# Patient Record
Sex: Male | Born: 1990 | Race: White | Hispanic: No | Marital: Married | Smoking: Former smoker
Health system: Southern US, Community
[De-identification: ages and names within clinical notes are randomized; demographics above are authoritative.]

## PROBLEM LIST (undated history)

## (undated) DIAGNOSIS — N200 Calculus of kidney: Secondary | ICD-10-CM

## (undated) DIAGNOSIS — J45909 Unspecified asthma, uncomplicated: Secondary | ICD-10-CM

## (undated) DIAGNOSIS — N289 Disorder of kidney and ureter, unspecified: Secondary | ICD-10-CM

## (undated) DIAGNOSIS — R41840 Attention and concentration deficit: Secondary | ICD-10-CM

## (undated) HISTORY — DX: Attention and concentration deficit: R41.840

## (undated) HISTORY — DX: Unspecified asthma, uncomplicated: J45.909

---
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
## 2013-01-31 LAB — LIPID PANEL
Cholesterol: 170 mg/dL (ref 0–200)
HDL: 48 mg/dL (ref 35–70)
LDL CALC: 92 mg/dL
Triglycerides: 152 mg/dL (ref 40–160)

## 2014-03-06 ENCOUNTER — Encounter (HOSPITAL_COMMUNITY): Payer: Self-pay | Admitting: Emergency Medicine

## 2014-03-06 ENCOUNTER — Emergency Department (HOSPITAL_COMMUNITY): Payer: BC Managed Care – PPO

## 2014-03-06 ENCOUNTER — Emergency Department (INDEPENDENT_AMBULATORY_CARE_PROVIDER_SITE_OTHER)
Admission: EM | Admit: 2014-03-06 | Discharge: 2014-03-06 | Disposition: A | Discharge status: Nursing Home (Includes ICF) | Payer: BC Managed Care – PPO | Source: Home / Self Care | Attending: Emergency Medicine | Admitting: Emergency Medicine

## 2014-03-06 ENCOUNTER — Emergency Department (HOSPITAL_COMMUNITY)
Admission: EM | Admit: 2014-03-06 | Discharge: 2014-03-07 | Disposition: A | Discharge status: Home / Self Care | Payer: BC Managed Care – PPO | Attending: Emergency Medicine | Admitting: Emergency Medicine

## 2014-03-06 DIAGNOSIS — R109 Unspecified abdominal pain: Secondary | ICD-10-CM

## 2014-03-06 DIAGNOSIS — K5792 Diverticulitis of intestine, part unspecified, without perforation or abscess without bleeding: Secondary | ICD-10-CM

## 2014-03-06 DIAGNOSIS — K5732 Diverticulitis of large intestine without perforation or abscess without bleeding: Secondary | ICD-10-CM

## 2014-03-06 DIAGNOSIS — N200 Calculus of kidney: Secondary | ICD-10-CM | POA: Insufficient documentation

## 2014-03-06 DIAGNOSIS — Z87891 Personal history of nicotine dependence: Secondary | ICD-10-CM | POA: Insufficient documentation

## 2014-03-06 LAB — COMPREHENSIVE METABOLIC PANEL
ALK PHOS: 64 U/L (ref 39–117)
ALT: 19 U/L (ref 0–53)
AST: 21 U/L (ref 0–37)
Albumin: 4.7 g/dL (ref 3.5–5.2)
BILIRUBIN TOTAL: 1.1 mg/dL (ref 0.3–1.2)
BUN: 9 mg/dL (ref 6–23)
CHLORIDE: 99 meq/L (ref 96–112)
CO2: 23 meq/L (ref 19–32)
Calcium: 10.1 mg/dL (ref 8.4–10.5)
Creatinine, Ser: 0.91 mg/dL (ref 0.50–1.35)
Glucose, Bld: 113 mg/dL — ABNORMAL HIGH (ref 70–99)
POTASSIUM: 4.1 meq/L (ref 3.7–5.3)
SODIUM: 139 meq/L (ref 137–147)
Total Protein: 8.4 g/dL — ABNORMAL HIGH (ref 6.0–8.3)

## 2014-03-06 LAB — URINALYSIS, ROUTINE W REFLEX MICROSCOPIC
Bilirubin Urine: NEGATIVE
Glucose, UA: NEGATIVE mg/dL
Ketones, ur: 15 mg/dL — AB
Leukocytes, UA: NEGATIVE
NITRITE: NEGATIVE
PROTEIN: 30 mg/dL — AB
Specific Gravity, Urine: 1.023 (ref 1.005–1.030)
Urobilinogen, UA: 1 mg/dL (ref 0.0–1.0)
pH: 6 (ref 5.0–8.0)

## 2014-03-06 LAB — CBC WITH DIFFERENTIAL/PLATELET
BASOS PCT: 0 % (ref 0–1)
Basophils Absolute: 0 10*3/uL (ref 0.0–0.1)
EOS ABS: 0 10*3/uL (ref 0.0–0.7)
Eosinophils Relative: 0 % (ref 0–5)
HCT: 48.3 % (ref 39.0–52.0)
Hemoglobin: 17.6 g/dL — ABNORMAL HIGH (ref 13.0–17.0)
Lymphocytes Relative: 14 % (ref 12–46)
Lymphs Abs: 2.1 10*3/uL (ref 0.7–4.0)
MCH: 32.1 pg (ref 26.0–34.0)
MCHC: 36.4 g/dL — AB (ref 30.0–36.0)
MCV: 88 fL (ref 78.0–100.0)
MONOS PCT: 3 % (ref 3–12)
Monocytes Absolute: 0.4 10*3/uL (ref 0.1–1.0)
NEUTROS ABS: 12.6 10*3/uL — AB (ref 1.7–7.7)
NEUTROS PCT: 83 % — AB (ref 43–77)
PLATELETS: 233 10*3/uL (ref 150–400)
RBC: 5.49 MIL/uL (ref 4.22–5.81)
RDW: 12.1 % (ref 11.5–15.5)
WBC: 15.2 10*3/uL — ABNORMAL HIGH (ref 4.0–10.5)

## 2014-03-06 LAB — URINE MICROSCOPIC-ADD ON

## 2014-03-06 MED ORDER — HYDROMORPHONE HCL PF 1 MG/ML IJ SOLN
INTRAMUSCULAR | Status: AC
  Filled 2014-03-06: qty 2

## 2014-03-06 MED ORDER — ONDANSETRON HCL 4 MG/2ML IJ SOLN
INTRAMUSCULAR | Status: AC
Start: 2014-03-06 — End: 2014-03-06
  Filled 2014-03-06: qty 2

## 2014-03-06 MED ORDER — SODIUM CHLORIDE 0.9 % IV BOLUS (SEPSIS)
500.0000 mL | Freq: Once | INTRAVENOUS | Status: AC
  Administered 2014-03-07: 500 mL via INTRAVENOUS

## 2014-03-06 MED ORDER — IOHEXOL 300 MG/ML  SOLN
20.0000 mL | INTRAMUSCULAR | Status: AC
  Administered 2014-03-06: 25 mL via ORAL

## 2014-03-06 MED ORDER — FENTANYL CITRATE 0.05 MG/ML IJ SOLN
50.0000 ug | Freq: Once | INTRAMUSCULAR | Status: AC
  Administered 2014-03-07: 50 ug via INTRAVENOUS
  Filled 2014-03-06: qty 2

## 2014-03-06 MED ORDER — HYDROMORPHONE HCL PF 1 MG/ML IJ SOLN
2.0000 mg | Freq: Once | INTRAMUSCULAR | Status: AC
  Administered 2014-03-06: 2 mg via INTRAMUSCULAR

## 2014-03-06 MED ORDER — ONDANSETRON HCL 4 MG/2ML IJ SOLN
4.0000 mg | Freq: Once | INTRAMUSCULAR | Status: AC
  Administered 2014-03-06: 4 mg via INTRAMUSCULAR

## 2014-03-06 NOTE — Discharge Instructions (Signed)
We have determined that your problem requires further evaluation in the emergency department.  We will take care of your transport there.  Once at the emergency department, you will be evaluated by a provider and they will order whatever treatment or tests they deem necessary.  We cannot guarantee that they will do any specific test or do any specific treatment.  ° ° ° °Diverticulitis °A diverticulum is a small pouch or sac on the colon. Diverticulosis is the presence of these diverticula on the colon. Diverticulitis is the irritation (inflammation) or infection of diverticula. °CAUSES  °The colon and its diverticula contain bacteria. If food particles block the tiny opening to a diverticulum, the bacteria inside can grow and cause an increase in pressure. This leads to infection and inflammation and is called diverticulitis. °SYMPTOMS  °· Abdominal pain and tenderness. Usually, the pain is located on the left side of your abdomen. However, it could be located elsewhere. °· Fever. °· Bloating. °· Feeling sick to your stomach (nausea). °· Throwing up (vomiting). °· Abnormal stools. °DIAGNOSIS  °Your caregiver will take a history and perform a physical exam. Since many things can cause abdominal pain, other tests may be necessary. Tests may include: °· Blood tests. °· Urine tests. °· X-ray of the abdomen. °· CT scan of the abdomen. °Sometimes, surgery is needed to determine if diverticulitis or other conditions are causing your symptoms. °TREATMENT  °Most of the time, you can be treated without surgery. Treatment includes: °· Resting the bowels by only having liquids for a few days. As you improve, you will need to eat a low-fiber diet. °· Intravenous (IV) fluids if you are losing body fluids (dehydrated). °· Antibiotic medicines that treat infections may be given. °· Pain and nausea medicine, if needed. °· Surgery if the inflamed diverticulum has burst. °HOME CARE INSTRUCTIONS  °· Try a clear liquid diet (broth, tea,  or water for as long as directed by your caregiver). You may then gradually begin a low-fiber diet as tolerated.  °A low-fiber diet is a diet with less than 10 grams of fiber. Choose the foods below to reduce fiber in the diet: °· White breads, cereals, rice, and pasta. °· Cooked fruits and vegetables or soft fresh fruits and vegetables without the skin. °· Ground or well-cooked tender beef, ham, veal, lamb, pork, or poultry. °· Eggs and seafood. °· After your diverticulitis symptoms have improved, your caregiver may put you on a high-fiber diet. A high-fiber diet includes 14 grams of fiber for every 1000 calories consumed. For a standard 2000 calorie diet, you would need 28 grams of fiber. Follow these diet guidelines to help you increase the fiber in your diet. It is important to slowly increase the amount fiber in your diet to avoid gas, constipation, and bloating. °· Choose whole-grain breads, cereals, pasta, and brown rice. °· Choose fresh fruits and vegetables with the skin on. Do not overcook vegetables because the more vegetables are cooked, the more fiber is lost. °· Choose more nuts, seeds, legumes, dried peas, beans, and lentils. °· Look for food products that have greater than 3 grams of fiber per serving on the Nutrition Facts label. °· Take all medicine as directed by your caregiver. °· If your caregiver has given you a follow-up appointment, it is very important that you go. Not going could result in lasting (chronic) or permanent injury, pain, and disability. If there is any problem keeping the appointment, call to reschedule. °SEEK MEDICAL CARE IF:  °·   Your pain does not improve. °· You have a hard time advancing your diet beyond clear liquids. °· Your bowel movements do not return to normal. °SEEK IMMEDIATE MEDICAL CARE IF:  °· Your pain becomes worse. °· You have an oral temperature above 102° F (38.9° C), not controlled by medicine. °· You have repeated vomiting. °· You have bloody or black,  tarry stools. °· Symptoms that brought you to your caregiver become worse or are not getting better. °MAKE SURE YOU:  °· Understand these instructions. °· Will watch your condition. °· Will get help right away if you are not doing well or get worse. °Document Released: 09/06/2005 Document Revised: 02/19/2012 Document Reviewed: 01/02/2011 °[employer]® Patient Information ©2014 [employer]. ° °

## 2014-03-06 NOTE — ED Notes (Signed)
Pt reports lower left abd pain that started this morning when he awakened. Pt denies having this pain before. Pt states he vomited 10 times in the last 24hrs, denies any diarrhea. Pt describes pain as cramping in nature. Pt rates pain 3/10, states he feels better after receiving dilaudid and zofran. [facility].

## 2014-03-06 NOTE — ED Notes (Signed)
Patient arrives with complaint of left sided mid abdominal pain starting today. Pain gradually increased until being unbearable at this point. Was seen at urgent care and was referred to ED for further evaluation. Has had GI related problems throughout life with evaluation for numerous GI issues.

## 2014-03-06 NOTE — ED Notes (Addendum)
Pt clarifies: "have been worked up before where they have mentioned IBS". "Have a family h/o crohns dz". "No dx'd hx made". Pt up to b/r for urine sample. Pt to xray. zofran and [facility]ud[facility]. [facility] note concerned for diverticulitis.

## 2014-03-06 NOTE — ED Notes (Signed)
Abdominal pain, onset this am.  Patient is vomiting.  Reports feeling like he needs to have a bm, last normal bm was yesterday

## 2014-03-06 NOTE — ED Provider Notes (Addendum)
  Chief Complaint   No chief complaint on file.   History of Present Illness   Marco Eaton is a 23 year old male who has had a history since this morning upon waking up of pain in his left flank radiating towards his left lower quadrant, nausea, emesis, constipation, subjective fever, chills, and sweats. He denies any blood in the vomitus or the stool and has had no coffee-ground emesis or bilious emesis. He denies any history of melena or urinary symptoms. He's never had anything like this before.  Review of Systems   Other than as noted above, the patient denies any of the following symptoms: Constitutional:  No weight loss or anorexia. Abdomen:  No hematememesis, melena, diarrhea, or hematochezia. GU:  No dysuria, frequency, urgency, or hematuria.  No testicular pain or swelling.  PMFSH   Past medical history, family history, social history, meds, and allergies were reviewed.   Physical Examination     Vital signs:  BP 146/85  Pulse 71  Temp(Src) 98.3 F (36.8 C) (Oral)  Resp 18  SpO2 99% Gen:  Alert, oriented, in considerable distress secondary to pain and he is vomiting intermittently. Lungs:  Breath sounds clear and equal bilaterally.  No wheezes, rales or rhonchi. Heart:  Regular rhythm.  No gallops or murmers.   Abdomen:  Soft, flat, nondistended. No organomegaly or mass. Bowel sounds are normally active. He is tender to palpation in his left flank and left lower quadrant as well as left upper quadrant without guarding or rebound.  Skin:  Clear, warm and dry.  No rash.  Course in Urgent Care Center   Given Dilaudid 2 mg IM and Zofran 4 mg IM.  Assessment   The encounter diagnosis was Diverticulitis.  Plan     The patient was transferred to the ED via shuttle in stable condition.  Medical Decision Making   23 year old male has a history since this morning of LLQ pain, nausea, vomiting, subjective fever, chills, sweats, and constipation.  On exam he is tender in  LLQ.  I am suspicious of diverticulitis.  We have given Zofran 4 mg IM and Dilaudid 2 mg IM and will transfer via shuttle.    Reuben Likesavid C Kessie Croston, MD 03/06/14 2053  Reuben Likesavid C Trafton Roker, MD 03/06/14 [phone]

## 2014-03-06 NOTE — ED Notes (Signed)
Patient received 2mg  Dilaudid and 4 mg Zofran Intramuscularly at Urgent care around 2100.

## 2014-03-07 ENCOUNTER — Emergency Department (HOSPITAL_COMMUNITY): Payer: BC Managed Care – PPO

## 2014-03-07 MED ORDER — HYDROCODONE-ACETAMINOPHEN 5-325 MG PO TABS: 2.0000 | ORAL_TABLET | ORAL | Status: DC | PRN

## 2014-03-07 MED ORDER — IOHEXOL 300 MG/ML  SOLN
100.0000 mL | Freq: Once | INTRAMUSCULAR | Status: AC | PRN
  Administered 2014-03-07: 100 mL via INTRAVENOUS

## 2014-03-07 MED ORDER — ONDANSETRON 4 MG PO TBDP: ORAL_TABLET | ORAL | Status: DC

## 2014-03-07 MED ORDER — KETOROLAC TROMETHAMINE 30 MG/ML IJ SOLN
30.0000 mg | Freq: Once | INTRAMUSCULAR | Status: AC
  Administered 2014-03-07: 30 mg via INTRAVENOUS
  Filled 2014-03-07: qty 1

## 2014-03-07 MED ORDER — TAMSULOSIN HCL 0.4 MG PO CAPS: 0.4000 mg | ORAL_CAPSULE | Freq: Every day | ORAL | Status: DC

## 2014-03-07 NOTE — ED Provider Notes (Signed)
CSN: [id number]     Arrival date & time 03/06/14  2119 History   First MD Initiated Contact with Patient 03/06/14 2300     Chief Complaint  Patient presents with  . Abdominal Pain     (Consider location/radiation/quality/duration/timing/severity/associated sxs/prior Treatment) HPI Comments: 23 year old male with no significant medical or surgical history presents from urgent care with left mid abdominal pain that started this morning. Initially it felt like bad constipation but persisted longer than any symptoms he had in the past and is different than previous symptoms. He has had GI issues in the past and had a colonoscopy for which he was told it was okay. No GI bleeding, mild vomiting without blood. Patient's symptoms improved after narcotics given at urgent care. He was sent over for further evaluation possible CT scan. Pain is severe 8 left lower quadrant, nonradiating. No known kidney stone history and no gross hematuria noted recently.  Patient is a 23 y.o. male presenting with abdominal pain. The history is provided by the patient.  Abdominal Pain Associated symptoms: nausea and vomiting   Associated symptoms: no chest pain, no chills, no dysuria, no fever and no shortness of breath     History reviewed. No pertinent past medical history. History reviewed. No pertinent past surgical history. History reviewed. No pertinent family history. History  Substance Use Topics  . Smoking status: Former Games developer  . Smokeless tobacco: Not on file  . Alcohol Use: Yes     Comment: Occassional    Review of Systems  Constitutional: Negative for fever and chills.  HENT: Negative for congestion.   Eyes: Negative for visual disturbance.  Respiratory: Negative for shortness of breath.   Cardiovascular: Negative for chest pain.  Gastrointestinal: Positive for nausea, vomiting and abdominal pain.  Genitourinary: Negative for dysuria and flank pain.  Musculoskeletal: Negative for back pain, neck  pain and neck stiffness.  Skin: Negative for rash.  Neurological: Negative for light-headedness and headaches.      Allergies  Review of patient's allergies indicates no known allergies.  Home Medications  No current outpatient prescriptions on file. BP 136/64  Pulse 62  Temp(Src) 98.4 F (36.9 C) (Oral)  Resp 20  SpO2 96% Physical Exam  Nursing note and vitals reviewed. Constitutional: He is oriented to person, place, and time. He appears well-developed and well-nourished.  HENT:  Head: Normocephalic and atraumatic.  Eyes: Conjunctivae are normal. Right eye exhibits no discharge. Left eye exhibits no discharge.  Neck: Normal range of motion. Neck supple. No tracheal deviation present.  Cardiovascular: Normal rate and regular rhythm.   Pulmonary/Chest: Effort normal and breath sounds normal.  Abdominal: Soft. He exhibits no distension. There is tenderness (left mid abdomen). There is no guarding.  Musculoskeletal: He exhibits no edema.  Neurological: He is alert and oriented to person, place, and time.  Skin: Skin is warm. No rash noted.  Psychiatric: He has a normal mood and affect.    ED Course  Procedures (including critical care time) Labs Review Labs Reviewed  COMPREHENSIVE METABOLIC PANEL - Abnormal; Notable for the following:    Glucose, Bld 113 (*)    Total Protein 8.4 (*)    All other components within normal limits  CBC WITH DIFFERENTIAL - Abnormal; Notable for the following:    WBC 15.2 (*)    Hemoglobin 17.6 (*)    MCHC 36.4 (*)    Neutrophils Relative % 83 (*)    Neutro Abs 12.6 (*)    All other components within  normal limits  URINALYSIS, ROUTINE W REFLEX MICROSCOPIC - Abnormal; Notable for the following:    APPearance CLOUDY (*)    Hgb urine dipstick LARGE (*)    Ketones, ur 15 (*)    Protein, ur 30 (*)    All other components within normal limits  URINE MICROSCOPIC-ADD ON   Imaging Review Ct Abdomen Pelvis W Contrast  03/07/2014   CLINICAL  DATA:  Left lower quadrant pain, vomiting  EXAM: CT ABDOMEN AND PELVIS WITH CONTRAST  TECHNIQUE: Multidetector CT imaging of the abdomen and pelvis was performed using the standard protocol following bolus administration of intravenous contrast.  CONTRAST:  100mL OMNIPAQUE IOHEXOL 300 MG/ML  SOLN  COMPARISON:  None.  FINDINGS: Lung bases are clear.  Liver, spleen, pancreas, and adrenal glands are within normal limits.  Gallbladder is unremarkable. No intrahepatic or extrahepatic ductal dilatation.  Two nonobstructing right upper pole renal calculi measuring up to 5 mm (coronal image 62). Left kidney is within normal limits. Mild left hydronephrosis.  No evidence of bowel obstruction. Normal appendix. Left colon is decompressed.  No evidence of abdominal aortic aneurysm.  No abdominopelvic ascites.  No suspicious abdominopelvic lymphadenopathy.  Prostate is unremarkable.  3 mm calculus in the mid left ureter (series 2/image 49).  Bladder is within normal limits.  Visualized osseous structures are within normal limits.  IMPRESSION: 3 mm mid left ureteral calculus with mild hydronephrosis.  Two nonobstructing right upper pole renal calculi measuring up to 5 mm.   Electronically Signed   By: Charline BillsSriyesh  Krishnan M.D.   On: 03/07/2014 01:41   Dg Abd 2 Views  03/06/2014   CLINICAL DATA:  Vomiting  EXAM: ABDOMEN - 2 VIEW  COMPARISON:  None.  FINDINGS: Nonspecific bowel gas pattern, without disproportionate small bowel dilatation to suggest small bowel obstruction.  No evidence of free air under the diaphragm on the upright view.  Visualized osseous structures are within normal limits.  IMPRESSION: No evidence of small bowel obstruction or free air.   Electronically Signed   By: Charline BillsSriyesh  Krishnan M.D.   On: 03/06/2014 23:06     EKG Interpretation None      MDM   Final diagnoses:  Kidney stone on left side  Left sided abdominal pain   Clinically concern for diverticulitis versus kidney stone versus other bowel  issue. Patient well-appearing mild pain on exam. Discussed risks and benefits of CT scan and patient agrees to proceed for further details. Pain medicines and IV fluids given in ED.    Pain improved in the ER and recheck. The scan reviewed and showed 3 mm kidney stone in the left. Discuss diagnosis and follow up with urology. Reasons to return discussed with patient in detail.    Enid SkeensJoshua M Brevin Mcfadden, MD 03/07/14 [phone]

## 2014-03-07 NOTE — ED Notes (Signed)
Discharge instructions reviewed with pt. Pt verbalized understanding.   

## 2014-03-07 NOTE — Discharge Instructions (Signed)
Take ibuprofen 600 mg every 6 hrs for pain. Stay well hydrated. Strain your urine. For severe pain take norco or vicodin however realize they have the potential for addiction and it can make you sleepy and has tylenol in it.  No operating machinery while taking. If you were given medicines take as directed.  If you are on coumadin or contraceptives realize their levels and effectiveness is altered by many different medicines.  If you have any reaction (rash, tongues swelling, other) to the medicines stop taking and see a physician.   Please follow up as directed and return to the ER or see a physician for new or worsening symptoms.  Thank you. Filed Vitals:   03/06/14 2137 03/06/14 2228 03/07/14 0051 03/07/14 0130  BP: 129/74 136/64 128/66 125/64  Pulse: 82 62 68 63  Temp: 98.4 F (36.9 C)     TempSrc: Oral     Resp: 18 20 18    SpO2: 97% 96% 99% 96%

## 2014-03-08 LAB — URINE CULTURE
CULTURE: NO GROWTH
Colony Count: NO GROWTH

## 2014-11-21 ENCOUNTER — Emergency Department: Payer: Self-pay | Admitting: Emergency Medicine

## 2014-11-21 LAB — URINALYSIS, COMPLETE
BILIRUBIN, UR: NEGATIVE
Glucose,UR: NEGATIVE mg/dL (ref 0–75)
Ketone: NEGATIVE
Nitrite: NEGATIVE
Ph: 6 (ref 4.5–8.0)
Protein: 30
SPECIFIC GRAVITY: 1.018 (ref 1.003–1.030)
Squamous Epithelial: NONE SEEN
Transitional Epi: 1
WBC UR: 370 /HPF (ref 0–5)

## 2014-11-23 LAB — URINE CULTURE

## 2015-03-07 ENCOUNTER — Encounter (HOSPITAL_COMMUNITY): Payer: Self-pay | Admitting: *Deleted

## 2015-03-07 ENCOUNTER — Emergency Department (HOSPITAL_COMMUNITY): Payer: BLUE CROSS/BLUE SHIELD

## 2015-03-07 ENCOUNTER — Emergency Department (HOSPITAL_COMMUNITY)
Admission: EM | Admit: 2015-03-07 | Discharge: 2015-03-07 | Disposition: A | Discharge status: Home / Self Care | Payer: BLUE CROSS/BLUE SHIELD | Attending: Emergency Medicine | Admitting: Emergency Medicine

## 2015-03-07 DIAGNOSIS — R0789 Other chest pain: Secondary | ICD-10-CM | POA: Insufficient documentation

## 2015-03-07 DIAGNOSIS — M940 Chondrocostal junction syndrome [Tietze]: Secondary | ICD-10-CM | POA: Diagnosis not present

## 2015-03-07 DIAGNOSIS — Z87442 Personal history of urinary calculi: Secondary | ICD-10-CM | POA: Diagnosis not present

## 2015-03-07 DIAGNOSIS — Z87448 Personal history of other diseases of urinary system: Secondary | ICD-10-CM | POA: Insufficient documentation

## 2015-03-07 DIAGNOSIS — R0602 Shortness of breath: Secondary | ICD-10-CM | POA: Diagnosis present

## 2015-03-07 DIAGNOSIS — Z720 Tobacco use: Secondary | ICD-10-CM | POA: Diagnosis not present

## 2015-03-07 HISTORY — DX: Calculus of kidney: N20.0

## 2015-03-07 HISTORY — DX: Disorder of kidney and ureter, unspecified: N28.9

## 2015-03-07 LAB — CBC WITH DIFFERENTIAL/PLATELET
Basophils Absolute: 0 10*3/uL (ref 0.0–0.1)
Basophils Relative: 0 % (ref 0–1)
Eosinophils Absolute: 0.2 10*3/uL (ref 0.0–0.7)
Eosinophils Relative: 2 % (ref 0–5)
HEMATOCRIT: 43.4 % (ref 39.0–52.0)
HEMOGLOBIN: 15.1 g/dL (ref 13.0–17.0)
LYMPHS ABS: 3.5 10*3/uL (ref 0.7–4.0)
Lymphocytes Relative: 30 % (ref 12–46)
MCH: 31.3 pg (ref 26.0–34.0)
MCHC: 34.8 g/dL (ref 30.0–36.0)
MCV: 89.9 fL (ref 78.0–100.0)
MONOS PCT: 9 % (ref 3–12)
Monocytes Absolute: 1.1 10*3/uL — ABNORMAL HIGH (ref 0.1–1.0)
Neutro Abs: 6.9 10*3/uL (ref 1.7–7.7)
Neutrophils Relative %: 59 % (ref 43–77)
Platelets: 222 10*3/uL (ref 150–400)
RBC: 4.83 MIL/uL (ref 4.22–5.81)
RDW: 12.4 % (ref 11.5–15.5)
WBC: 11.7 10*3/uL — ABNORMAL HIGH (ref 4.0–10.5)

## 2015-03-07 LAB — BASIC METABOLIC PANEL
Anion gap: 5 (ref 5–15)
BUN: 10 mg/dL (ref 6–23)
CALCIUM: 9 mg/dL (ref 8.4–10.5)
CHLORIDE: 103 mmol/L (ref 96–112)
CO2: 30 mmol/L (ref 19–32)
Creatinine, Ser: 1.08 mg/dL (ref 0.50–1.35)
Glucose, Bld: 101 mg/dL — ABNORMAL HIGH (ref 70–99)
Potassium: 4 mmol/L (ref 3.5–5.1)
Sodium: 138 mmol/L (ref 135–145)

## 2015-03-07 LAB — BRAIN NATRIURETIC PEPTIDE: B Natriuretic Peptide: 10.5 pg/mL (ref 0.0–100.0)

## 2015-03-07 LAB — TROPONIN I: Troponin I: <0.03 ng/mL (ref <0.031)

## 2015-03-07 MED ORDER — NAPROXEN 500 MG PO TABS: ORAL_TABLET | ORAL | Status: DC

## 2015-03-07 MED ORDER — KETOROLAC TROMETHAMINE 60 MG/2ML IM SOLN
60.0000 mg | Freq: Once | INTRAMUSCULAR | Status: AC
  Administered 2015-03-07: 60 mg via INTRAMUSCULAR
  Filled 2015-03-07: qty 2

## 2015-03-07 NOTE — ED Notes (Signed)
The pt ius c/o sob and some chest pain when he runs since Friday. No cough no temp

## 2015-03-07 NOTE — ED Notes (Signed)
The pt reports that the pain is worse with movement

## 2015-03-07 NOTE — ED Provider Notes (Signed)
CSN: [id number]     Arrival date & time 03/07/15  0450 History   First MD Initiated Contact with Patient 03/07/15 0518     Chief Complaint  Patient presents with  . Shortness of Breath     (Consider location/radiation/quality/duration/timing/severity/associated sxs/prior Treatment) HPI  Patient reports on March 25 and his girlfriend were running around a playground playing tag at night. He states he was running off and on for about 45 minutes. He states he normally doesn't do that kind of physical activity. He states he woke up yesterday with central chest pain that he states is dull and constant however if he takes a big deep breath or if he moves a certain way the pain becomes sharp and more intense. He denies shortness of breath, cough, fever. He denies falling or injuring himself. He states he took a leftover hydrocodone which helped with his pain.  Family history is negative for coronary artery disease  [place] in [place]  Past Medical History  Diagnosis Date  . Renal disorder   . Kidney stone    History reviewed. No pertinent past surgical history. No family history on file. History  Substance Use Topics  . Smoking status: Current Every Day Smoker  . Smokeless tobacco: Not on file  . Alcohol Use: Yes     Comment: Occassional   patient smokes 1 pack per day and smokes a cigarettes Patient is employed  Review of Systems  All other systems reviewed and are negative.     Allergies  Review of patient's allergies indicates no known allergies.  Home Medications   Prior to Admission medications   Medication Sig Start Date End Date Taking? Authorizing Provider  HYDROcodone-acetaminophen (NORCO) 5-325 MG per tablet Take 2 tablets by mouth every 4 (four) hours as needed. 03/07/14   Blane OharaJoshua Zavitz, MD  naproxen (NAPROSYN) 500 MG tablet Take 1 po BID with food prn pain 03/07/15   Devoria AlbeIva Gildardo Tickner, MD  ondansetron (ZOFRAN ODT) 4 MG disintegrating tablet 4mg  ODT q4 hours prn  nausea/vomit 03/07/14   Blane OharaJoshua Zavitz, MD  tamsulosin (FLOMAX) 0.4 MG CAPS capsule Take 1 capsule (0.4 mg total) by mouth daily. 03/07/14   Blane OharaJoshua Zavitz, MD   BP 129/78 mmHg  Pulse 86  Temp(Src) 98.9 F (37.2 C)  Resp 19  Ht 5\' 11"  (1.803 m)  Wt 180 lb (81.647 kg)  BMI 25.12 kg/m2  SpO2 99%  Vital signs normal   Physical Exam  Constitutional: He is oriented to person, place, and time. He appears well-developed and well-nourished.  Non-toxic appearance. He does not appear ill. No distress.  HENT:  Head: Normocephalic and atraumatic.  Right Ear: External ear normal.  Left Ear: External ear normal.  Nose: Nose normal. No mucosal edema or rhinorrhea.  Mouth/Throat: Oropharynx is clear and moist and mucous membranes are normal. No dental abscesses or uvula swelling.  Eyes: Conjunctivae and EOM are normal. Pupils are equal, round, and reactive to light.  Neck: Normal range of motion and full passive range of motion without pain. Neck supple.  Cardiovascular: Normal rate, regular rhythm and normal heart sounds.  Exam reveals no gallop and no friction rub.   No murmur heard. Pulmonary/Chest: Effort normal and breath sounds normal. No respiratory distress. He has no wheezes. He has no rhonchi. He has no rales. He exhibits tenderness. He exhibits no crepitus.    Abdominal: Soft. Normal appearance and bowel sounds are normal. He exhibits no distension. There is no tenderness. There is no  rebound and no guarding.  Musculoskeletal: Normal range of motion. He exhibits no edema or tenderness.  Moves all extremities well.   Neurological: He is alert and oriented to person, place, and time. He has normal strength. No cranial nerve deficit.  Skin: Skin is warm, dry and intact. No rash noted. No erythema. No pallor.  Psychiatric: He has a normal mood and affect. His speech is normal and behavior is normal. His mood appears not anxious.  Nursing note and vitals reviewed.   ED Course  Procedures  (including critical care time)  Medications  ketorolac (TORADOL) injection 60 mg (not administered)   Patient was given Toradol for his chest wall pain. Patient was given the results of his x-ray and tests.     Labs Review Results for orders placed or performed during the hospital encounter of 03/07/15  Basic metabolic panel  Result Value Ref Range   Sodium 138 135 - 145 mmol/L   Potassium 4.0 3.5 - 5.1 mmol/L   Chloride 103 96 - 112 mmol/L   CO2 30 19 - 32 mmol/L   Glucose, Bld 101 (H) 70 - 99 mg/dL   BUN 10 6 - 23 mg/dL   Creatinine, Ser 6.29 0.50 - 1.35 mg/dL   Calcium 9.0 8.4 - 52.8 mg/dL   GFR calc non Af Amer >90 >90 mL/min   GFR calc Af Amer >90 >90 mL/min   Anion gap 5 5 - 15  BNP (order ONLY if patient complains of dyspnea/SOB AND you have documented it for THIS visit)  Result Value Ref Range   B Natriuretic Peptide 10.5 0.0 - 100.0 pg/mL  CBC with Differential  Result Value Ref Range   WBC 11.7 (H) 4.0 - 10.5 K/uL   RBC 4.83 4.22 - 5.81 MIL/uL   Hemoglobin 15.1 13.0 - 17.0 g/dL   HCT 41.3 24.4 - 01.0 %   MCV 89.9 78.0 - 100.0 fL   MCH 31.3 26.0 - 34.0 pg   MCHC 34.8 30.0 - 36.0 g/dL   RDW 27.2 53.6 - 64.4 %   Platelets 222 150 - 400 K/uL   Neutrophils Relative % 59 43 - 77 %   Neutro Abs 6.9 1.7 - 7.7 K/uL   Lymphocytes Relative 30 12 - 46 %   Lymphs Abs 3.5 0.7 - 4.0 K/uL   Monocytes Relative 9 3 - 12 %   Monocytes Absolute 1.1 (H) 0.1 - 1.0 K/uL   Eosinophils Relative 2 0 - 5 %   Eosinophils Absolute 0.2 0.0 - 0.7 K/uL   Basophils Relative 0 0 - 1 %   Basophils Absolute 0.0 0.0 - 0.1 K/uL  Troponin I  Result Value Ref Range   Troponin I <0.03 <0.031 ng/mL    Laboratory interpretation all normal except leukocytosis    Imaging Review Dg Chest 2 View  03/07/2015   CLINICAL DATA:  Mid chest pain for 2 days, increased this morning. Shortness of breath.  EXAM: CHEST  2 VIEW  COMPARISON:  None.  FINDINGS: The cardiomediastinal contours are normal. The  lungs are clear. Pulmonary vasculature is normal. No consolidation, pleural effusion, or pneumothorax. No acute osseous abnormalities are seen.  IMPRESSION: No acute pulmonary process.   Electronically Signed   By: Rubye Oaks M.D.   On: 03/07/2015 06:33     EKG Interpretation   Date/Time:  Sunday March 07 2015 04:54:42 EDT Ventricular Rate:  89 PR Interval:  126 QRS Duration: 82 QT Interval:  326 QTC Calculation: 396  R Axis:   81 Text Interpretation:  Normal sinus rhythm ST elevation, consider early  repolarization No old tracing to compare Confirmed by Lavontay Kirk  MD-I, Shenell Rogalski  ([id number]) on 03/07/2015 6:10:27 AM      MDM   Final diagnoses:  Chest wall pain  Acute costochondritis    New Prescriptions   NAPROXEN (NAPROSYN) 500 MG TABLET    Take 1 po BID with food prn pain    Plan discharge  Devoria Albe, MD, Concha Pyo, MD 03/07/15 [phone]

## 2015-03-07 NOTE — Discharge Instructions (Signed)
Use ice and heat on your chest for comfort. Take the naproxen for pain. Recheck if you get a fever, struggle to breathe or feel worse.     Costochondritis Costochondritis, sometimes called Tietze syndrome, is a swelling and irritation (inflammation) of the tissue (cartilage) that connects your ribs with your breastbone (sternum). It causes pain in the chest and rib area. Costochondritis usually goes away on its own over time. It can take up to 6 weeks or longer to get better, especially if you are unable to limit your activities. CAUSES  Some cases of costochondritis have no known cause. Possible causes include:  Injury (trauma).  Exercise or activity such as lifting.  Severe coughing. SIGNS AND SYMPTOMS  Pain and tenderness in the chest and rib area.  Pain that gets worse when coughing or taking deep breaths.  Pain that gets worse with specific movements. DIAGNOSIS  Your health care provider will do a physical exam and ask about your symptoms. Chest X-rays or other tests may be done to rule out other problems. TREATMENT  Costochondritis usually goes away on its own over time. Your health care provider may prescribe medicine to help relieve pain. HOME CARE INSTRUCTIONS   Avoid exhausting physical activity. Try not to strain your ribs during normal activity. This would include any activities using chest, abdominal, and side muscles, especially if heavy weights are used.  Apply ice to the affected area for the first 2 days after the pain begins.  Put ice in a plastic bag.  Place a towel between your skin and the bag.  Leave the ice on for 20 minutes, 2-3 times a day.  Only take over-the-counter or prescription medicines as directed by your health care provider. SEEK MEDICAL CARE IF:  You have redness or swelling at the rib joints. These are signs of infection.  Your pain does not go away despite rest or medicine. SEEK IMMEDIATE MEDICAL CARE IF:   Your pain increases or you  are very uncomfortable.  You have shortness of breath or difficulty breathing.  You cough up blood.  You have worse chest pains, sweating, or vomiting.  You have a fever or persistent symptoms for more than 2-3 days.  You have a fever and your symptoms suddenly get worse. MAKE SURE YOU:   Understand these instructions.  Will watch your condition.  Will get help right away if you are not doing well or get worse. Document Released: 09/06/2005 Document Revised: 09/17/2013 Document Reviewed: 07/01/2013 [facility] Patient Information 2015 [place], [place]. This information is not intended to replace advice given to you by your health care provider. Make sure you discuss any questions you have with your health care provider.

## 2015-05-25 ENCOUNTER — Emergency Department (HOSPITAL_COMMUNITY)
Admission: EM | Admit: 2015-05-25 | Discharge: 2015-05-25 | Disposition: A | Discharge status: Home / Self Care | Payer: PRIVATE HEALTH INSURANCE | Attending: Emergency Medicine | Admitting: Emergency Medicine

## 2015-05-25 ENCOUNTER — Emergency Department (HOSPITAL_COMMUNITY): Payer: PRIVATE HEALTH INSURANCE

## 2015-05-25 ENCOUNTER — Encounter (HOSPITAL_COMMUNITY): Payer: Self-pay | Admitting: *Deleted

## 2015-05-25 DIAGNOSIS — S61451A Open bite of right hand, initial encounter: Secondary | ICD-10-CM | POA: Insufficient documentation

## 2015-05-25 DIAGNOSIS — S61452A Open bite of left hand, initial encounter: Secondary | ICD-10-CM | POA: Insufficient documentation

## 2015-05-25 DIAGNOSIS — S61215A Laceration without foreign body of left ring finger without damage to nail, initial encounter: Secondary | ICD-10-CM | POA: Diagnosis not present

## 2015-05-25 DIAGNOSIS — Y929 Unspecified place or not applicable: Secondary | ICD-10-CM | POA: Insufficient documentation

## 2015-05-25 DIAGNOSIS — W540XXA Bitten by dog, initial encounter: Secondary | ICD-10-CM | POA: Insufficient documentation

## 2015-05-25 DIAGNOSIS — Y999 Unspecified external cause status: Secondary | ICD-10-CM | POA: Insufficient documentation

## 2015-05-25 DIAGNOSIS — Z87448 Personal history of other diseases of urinary system: Secondary | ICD-10-CM | POA: Insufficient documentation

## 2015-05-25 DIAGNOSIS — Z720 Tobacco use: Secondary | ICD-10-CM | POA: Insufficient documentation

## 2015-05-25 DIAGNOSIS — Z23 Encounter for immunization: Secondary | ICD-10-CM | POA: Insufficient documentation

## 2015-05-25 DIAGNOSIS — Z79899 Other long term (current) drug therapy: Secondary | ICD-10-CM | POA: Insufficient documentation

## 2015-05-25 DIAGNOSIS — S61131A Puncture wound without foreign body of right thumb with damage to nail, initial encounter: Secondary | ICD-10-CM | POA: Diagnosis not present

## 2015-05-25 DIAGNOSIS — Z87442 Personal history of urinary calculi: Secondary | ICD-10-CM | POA: Diagnosis not present

## 2015-05-25 DIAGNOSIS — Y939 Activity, unspecified: Secondary | ICD-10-CM | POA: Diagnosis not present

## 2015-05-25 MED ORDER — LIDOCAINE-EPINEPHRINE (PF) 2 %-1:200000 IJ SOLN
10.0000 mL | Freq: Once | INTRAMUSCULAR | Status: AC
  Administered 2015-05-25: 10 mL via INTRADERMAL
  Filled 2015-05-25: qty 20

## 2015-05-25 MED ORDER — BUPIVACAINE HCL (PF) 0.25 % IJ SOLN
10.0000 mL | Freq: Once | INTRAMUSCULAR | Status: AC
  Administered 2015-05-25: 10 mL
  Filled 2015-05-25 (×2): qty 10

## 2015-05-25 MED ORDER — OXYCODONE-ACETAMINOPHEN 5-325 MG PO TABS
1.0000 | ORAL_TABLET | Freq: Once | ORAL | Status: AC
  Administered 2015-05-25: 1 via ORAL
  Filled 2015-05-25: qty 1

## 2015-05-25 MED ORDER — IBUPROFEN 600 MG PO TABS: 600.0000 mg | ORAL_TABLET | Freq: Four times a day (QID) | ORAL | Status: DC | PRN

## 2015-05-25 MED ORDER — CEFAZOLIN SODIUM 1-5 GM-% IV SOLN
1.0000 g | Freq: Three times a day (TID) | INTRAVENOUS | Status: DC
  Administered 2015-05-25: 1 g via INTRAVENOUS
  Filled 2015-05-25 (×3): qty 50

## 2015-05-25 MED ORDER — OXYCODONE-ACETAMINOPHEN 5-325 MG PO TABS: 1.0000 | ORAL_TABLET | ORAL | Status: DC | PRN

## 2015-05-25 MED ORDER — AMOXICILLIN-POT CLAVULANATE 875-125 MG PO TABS: 1.0000 | ORAL_TABLET | Freq: Two times a day (BID) | ORAL | Status: DC

## 2015-05-25 MED ORDER — TETANUS-DIPHTH-ACELL PERTUSSIS 5-2.5-18.5 LF-MCG/0.5 IM SUSP
0.5000 mL | Freq: Once | INTRAMUSCULAR | Status: AC
  Administered 2015-05-25: 0.5 mL via INTRAMUSCULAR
  Filled 2015-05-25: qty 0.5

## 2015-05-25 NOTE — ED Provider Notes (Signed)
CSN: [id number]     Arrival date & time 05/25/15  1936 History   This chart was scribed for non-physician practitioner [name], PA-C working with Arby Barrette, MD by Lyndel Safe, ED Scribe. This patient was seen in room TR03C/TR03C and the patient's care was started at 8:15 PM.     Chief Complaint  Patient presents with  . Animal Bite   The history is provided by the patient and a parent. No language interpreter was used.    HPI Comments: Marco Eaton is a 24 y.o. male, with a PMhx of renal disorder, who presents to the Emergency Department complaining of constant, moderate wounds to hands bilaterally s/p dog bite. Bleeding is controlled and bandages were applied pta. On his right hand the wounds are more extensive on the thumb. His left hand has a superficial wound to the 3rd digit. Pt reports he was bitten by his black lab after the dog was hit by a car and he tried to help the dog. Father reports dog's vaccines are UTD. Pt is unsure when his last tetanus shot was. NKDA   Past Medical History  Diagnosis Date  . Renal disorder   . Kidney stone    History reviewed. No pertinent past surgical history. History reviewed. No pertinent family history. History  Substance Use Topics  . Smoking status: Current Every Day Smoker  . Smokeless tobacco: Not on file  . Alcohol Use: Yes     Comment: Occassional    Review of Systems  Skin: Positive for color change and wound.   Allergies  Review of patient's allergies indicates no known allergies.  Home Medications   Prior to Admission medications   Medication Sig Start Date End Date Taking? Authorizing Provider  HYDROcodone-acetaminophen (NORCO) 5-325 MG per tablet Take 2 tablets by mouth every 4 (four) hours as needed. 03/07/14   Blane Ohara, MD  naproxen (NAPROSYN) 500 MG tablet Take 1 po BID with food prn pain 03/07/15   Devoria Albe, MD  ondansetron (ZOFRAN ODT) 4 MG disintegrating tablet  ODT q4 hours prn nausea/vomit  03/07/14   Blane Ohara, MD  tamsulosin (FLOMAX) 0.4 MG CAPS capsule Take 1 capsule (0.4 mg total) by mouth daily. 03/07/14   Blane Ohara, MD   BP 133/70 mmHg  Pulse 74  Resp 20  Wt 180 lb (81.647 kg)  SpO2 100% Physical Exam  Constitutional: He is oriented to person, place, and time. He appears well-developed and well-nourished. No distress.  HENT:  Head: Normocephalic.  Right Ear: External ear normal.  Left Ear: External ear normal.  Mouth/Throat: No oropharyngeal exudate.  Eyes: Pupils are equal, round, and reactive to light. Right eye exhibits no discharge. Left eye exhibits no discharge. No scleral icterus.  Neck: No JVD present.  Cardiovascular: Normal rate, regular rhythm and normal heart sounds.   Pulmonary/Chest: Effort normal and breath sounds normal. No respiratory distress.  Musculoskeletal: He exhibits no edema.  puncture wounds to the right thumb, just proximal to the MCP joint. There is subungual hematoma into the right thumbnail with small wound on top of it. Thumb is diffusely tender, pain with any range of motion at MCP or IP joints. There is a superficial laceration to the left fourth finger. Full range of motion of the finger. Normal wrist and hand otherwise bilaterally. Cap refill all less than 2 seconds distally. Sensation is intact in both distal thumb and left ring finger.  Lymphadenopathy:    He has no cervical adenopathy.  Neurological:  He is alert and oriented to person, place, and time.  Skin: Skin is warm. No rash noted. No erythema. No pallor.  Psychiatric: He has a normal mood and affect. His behavior is normal.  Nursing note and vitals reviewed.  ED Course  Procedures  DIAGNOSTIC STUDIES: Oxygen Saturation is 100% on RA, normal by my interpretation.    COORDINATION OF CARE: 8:24 PM Discussed treatment plan which includes to order a tetanus injection and a hand X-ray. Pt acknowledges and agrees to plan.   Labs Review Labs Reviewed - No data to  display  Imaging Review Dg Hand Complete Right  05/25/2015   CLINICAL DATA:  Animal bite.  Initial encounter.  MCP joint pain.  EXAM: RIGHT HAND - COMPLETE 3+ VIEW  COMPARISON:  None.  FINDINGS: Gas is present in the soft tissues around the thumb. No radiopaque foreign body. No fracture. The alignment of the hand is anatomic.  IMPRESSION: No acute osseous injury. Gas in the soft tissues around the first metacarpal compatible with animal bite.   Electronically Signed   By: Andreas Newport M.D.   On: 05/25/2015 21:25     EKG Interpretation None      MDM   Final diagnoses:  Dog bite of right hand, initial encounter  Dog bite of left hand, initial encounter   patient with multiple dog bite wounds. Irrigated thoroughly and the ER using 1 L of normal saline an 18-gauge needle after numbing wounds with bupivacaine. Also drained subungual hematoma with 18-gauge needle. X-ray was obtained of the right hand is negative. I discussed patient with Dr. Janee Morn, due to concern of puncture wounds close to the right thumb joint. He did see the patient and said he was not concerned at this time, advised antibiotics here in emergency department and at home, pain medications, follow up as needed.  Filed Vitals:   05/25/15 2005 05/25/15 2310  BP: 133/70 130/68  Pulse: 74 63  Temp:  98.3 F (36.8 C)  TempSrc: Oral Oral  Resp: 20 18  Weight: 180 lb (81.647 kg)   SpO2: 100% 98%   I personally performed the services described in this documentation, which was scribed in my presence. The recorded information has been reviewed and is accurate.    [name], PA-C 05/27/15 0011  Arby Barrette, MD 05/28/15 [phone]

## 2015-05-25 NOTE — ED Notes (Signed)
Pt in after dog bite to bilateral hands, c/o numbness to his right thumb but is able to move it, was his dog, states it was UTD on vaccinations

## 2015-05-25 NOTE — Discharge Instructions (Signed)
Ibuprofen for pain. Percocet for severe pain only. Apply topical antibiotics ointment to the cut several times a day. Keep hand clean, wash with soap and water. Take Augmentin as prescribed until all gone. Follow-up with Dr. Janee Morn as needed.   Animal Bite An animal bite can result in a scratch on the skin, deep open cut, puncture of the skin, crush injury, or tearing away of the skin or a body part. Dogs are responsible for most animal bites. Children are bitten more often than adults. An animal bite can range from very mild to more serious. A small bite from your house pet is no cause for alarm. However, some animal bites can become infected or injure a bone or other tissue. You must seek medical care if:  The skin is broken and bleeding does not slow down or stop after 15 minutes.  The puncture is deep and difficult to clean (such as a cat bite).  Pain, warmth, redness, or pus develops around the wound.  The bite is from a stray animal or rodent. There may be a risk of rabies infection.  The bite is from a snake, raccoon, skunk, fox, coyote, or bat. There may be a risk of rabies infection.  The person bitten has a chronic illness such as diabetes, liver disease, or cancer, or the person takes medicine that lowers the immune system.  There is concern about the location and severity of the bite. It is important to clean and protect an animal bite wound right away to prevent infection. Follow these steps:  Clean the wound with plenty of water and soap.  Apply an antibiotic cream.  Apply gentle pressure over the wound with a clean towel or gauze to slow or stop bleeding.  Elevate the affected area above the heart to help stop any bleeding.  Seek medical care. Getting medical care within 8 hours of the animal bite leads to the best possible outcome. DIAGNOSIS  Your caregiver will most likely:  Take a detailed history of the animal and the bite injury.  Perform a wound exam.  Take  your medical history. Blood tests or X-rays may be performed. Sometimes, infected bite wounds are cultured and sent to a lab to identify the infectious bacteria.  TREATMENT  Medical treatment will depend on the location and type of animal bite as well as the patient's medical history. Treatment may include:  Wound care, such as cleaning and flushing the wound with saline solution, bandaging, and elevating the affected area.  Antibiotics.  Tetanus immunization.  Rabies immunization.  Leaving the wound open to heal. This is often done with animal bites, due to the high risk of infection. However, in certain cases, wound closure with stitches, wound adhesive, skin adhesive strips, or staples may be used. Infected bites that are left untreated may require intravenous (IV) antibiotics and surgical treatment in the hospital. HOME CARE INSTRUCTIONS  Follow your caregiver's instructions for wound care.  Take all medicines as directed.  If your caregiver prescribes antibiotics, take them as directed. Finish them even if you start to feel better.  Follow up with your caregiver for further exams or immunizations as directed. You may need a tetanus shot if:  You cannot remember when you had your last tetanus shot.  You have never had a tetanus shot.  The injury broke your skin. If you get a tetanus shot, your arm may swell, get red, and feel warm to the touch. This is common and not a problem. If  you need a tetanus shot and you choose not to have one, there is a rare chance of getting tetanus. Sickness from tetanus can be serious. SEEK MEDICAL CARE IF:  You notice warmth, redness, soreness, swelling, pus discharge, or a bad smell coming from the wound.  You have a red line on the skin coming from the wound.  You have a fever, chills, or a general ill feeling.  You have nausea or vomiting.  You have continued or worsening pain.  You have trouble moving the injured part.  You have  other questions or concerns. MAKE SURE YOU:  Understand these instructions.  Will watch your condition.  Will get help right away if you are not doing well or get worse. Document Released: 08/15/2011 Document Revised: 02/19/2012 Document Reviewed: 08/15/2011 [facility] Patient Information 2015 [place], [place]. This information is not intended to replace advice given to you by your health care provider. Make sure you discuss any questions you have with your health care provider.

## 2015-12-10 ENCOUNTER — Ambulatory Visit: Payer: Self-pay | Admitting: Licensed Clinical Social Worker

## 2015-12-14 ENCOUNTER — Ambulatory Visit: Payer: PRIVATE HEALTH INSURANCE | Admitting: Licensed Clinical Social Worker

## 2015-12-14 ENCOUNTER — Ambulatory Visit (INDEPENDENT_AMBULATORY_CARE_PROVIDER_SITE_OTHER): Payer: BLUE CROSS/BLUE SHIELD | Admitting: Licensed Clinical Social Worker

## 2015-12-14 DIAGNOSIS — F331 Major depressive disorder, recurrent, moderate: Secondary | ICD-10-CM

## 2016-01-04 ENCOUNTER — Telehealth: Payer: Self-pay

## 2016-01-04 DIAGNOSIS — M940 Chondrocostal junction syndrome [Tietze]: Secondary | ICD-10-CM

## 2016-01-04 NOTE — Telephone Encounter (Signed)
Pt called in today with chest pain.  He reports is started about two days ago.  He describes it as a dull achy pain.  Today he has become short of breath and says it "hurts to take a deep breath", also has arm pain as well.  He has been seen in the ER last year for similar symptoms and the doctor diagnosed him with costochondritis.  I did advised him with the symptoms worsening and now with Shortness of breath he should go to the ER to be checked out.  He was reluctant to go but will think about it.  In the meantime he does have an appointment with you at 10am tomorrow.   Thanks,   -Vernona Rieger

## 2016-01-05 ENCOUNTER — Ambulatory Visit (INDEPENDENT_AMBULATORY_CARE_PROVIDER_SITE_OTHER): Payer: BLUE CROSS/BLUE SHIELD | Admitting: Family Medicine

## 2016-01-05 ENCOUNTER — Ambulatory Visit
Admission: RE | Admit: 2016-01-05 | Discharge: 2016-01-05 | Disposition: A | Discharge status: Home / Self Care | Payer: BLUE CROSS/BLUE SHIELD | Source: Ambulatory Visit | Attending: Family Medicine | Admitting: Family Medicine

## 2016-01-05 ENCOUNTER — Encounter: Payer: Self-pay | Admitting: Family Medicine

## 2016-01-05 VITALS — BP 110/66 | HR 78 | Temp 98.0°F | Resp 20 | Wt 170.0 lb

## 2016-01-05 DIAGNOSIS — R41840 Attention and concentration deficit: Secondary | ICD-10-CM | POA: Insufficient documentation

## 2016-01-05 DIAGNOSIS — R079 Chest pain, unspecified: Secondary | ICD-10-CM | POA: Insufficient documentation

## 2016-01-05 MED ORDER — PREDNISONE 20 MG PO TABS: ORAL_TABLET | ORAL | Status: DC

## 2016-01-05 NOTE — Progress Notes (Signed)
       Patient: Marco Eaton Male    DOB: 04/01/1991   24 y.o.   MRN: [id number] Visit Date: 01/05/2016  Today's Provider: Mila Merry, MD   Chief Complaint  Patient presents with  . Chest Pain    x 2 days   Subjective:    Chest Pain  This is a new problem. Episode onset: started 2 days ago. The problem occurs constantly. The problem has been gradually worsening. The pain is moderate. The quality of the pain is described as crushing. Associated symptoms include exertional chest pressure. Pertinent negatives include no abdominal pain, back pain, cough, diaphoresis, dizziness, fever, headaches, leg pain, nausea, numbness, palpitations, shortness of breath, vomiting or weakness.  Pain worsens when changing positions or deep breathing. Patient has had similar chest pain in the past and was diagnosed with Acute costochondritis.      No Known Allergies Previous Medications   No medications on file    Review of Systems  Constitutional: Negative for fever, chills, diaphoresis, appetite change and fatigue.  Respiratory: Negative for cough, chest tightness, shortness of breath and wheezing.        Hurts to deep breath  Cardiovascular: Positive for chest pain. Negative for palpitations.  Gastrointestinal: Negative for nausea, vomiting and abdominal pain.  Musculoskeletal: Negative for back pain.  Neurological: Negative for dizziness, weakness, numbness and headaches.    Social History  Substance Use Topics  . Smoking status: Current Every Day Smoker -- 1.00 packs/day    Types: Cigarettes    Last Attempt to Quit: 11/10/2012  . Smokeless tobacco: Not on file  . Alcohol Use: 0.0 oz/week    0 Standard drinks or equivalent per week     Comment: Occassional   Objective:   BP 110/66 mmHg  Pulse 78  Temp(Src) 98 F (36.7 C) (Oral)  Resp 20  Wt 170 lb (77.111 kg)  SpO2 99%  Physical Exam   General Appearance:    Alert, cooperative, no distress  Eyes:    PERRL,  conjunctiva/corneas clear, EOM's intact       Lungs:     Clear to auscultation bilaterally, respirations unlabored  Heart:    Regular rate and rhythm  Neurologic:   Awake, alert, oriented x 3. No apparent focal neurological           defect.   MS:    Moderate tenderness of sternum. No gross deformities.       Assessment & Plan:     1. Chest pain, unspecified chest pain type Likely costochondritis. If XR negative will start prednisone.  - DG Chest 2 View; Future - EKG 12-Lead       Mila Merry, MD  [facility]

## 2016-01-05 NOTE — Telephone Encounter (Signed)
Patient advised as below and verbally voiced understanding. Prescription sent into pharmacy.  

## 2016-01-05 NOTE — Telephone Encounter (Signed)
NA. sd 

## 2016-01-05 NOTE — Telephone Encounter (Signed)
-----   Message from Malva Limes, MD sent at 01/05/2016 12:33 PM EST ----- Chest xray is normal. Pain is likely from costochondritis. Recommend he start prednisone  one tablet 2-3 times a day as needed for pain, #20. No refills. Call if not much much better over the weekend, or if pain returns after finishing prednisone.

## 2016-01-25 ENCOUNTER — Emergency Department (HOSPITAL_COMMUNITY): Payer: BLUE CROSS/BLUE SHIELD

## 2016-01-25 ENCOUNTER — Encounter (HOSPITAL_COMMUNITY): Payer: Self-pay | Admitting: Neurology

## 2016-01-25 ENCOUNTER — Emergency Department (HOSPITAL_COMMUNITY)
Admission: EM | Admit: 2016-01-25 | Discharge: 2016-01-25 | Disposition: A | Discharge status: Home / Self Care | Payer: BLUE CROSS/BLUE SHIELD | Attending: Emergency Medicine | Admitting: Emergency Medicine

## 2016-01-25 DIAGNOSIS — N200 Calculus of kidney: Secondary | ICD-10-CM | POA: Insufficient documentation

## 2016-01-25 DIAGNOSIS — F17210 Nicotine dependence, cigarettes, uncomplicated: Secondary | ICD-10-CM | POA: Diagnosis not present

## 2016-01-25 DIAGNOSIS — R1031 Right lower quadrant pain: Secondary | ICD-10-CM | POA: Diagnosis present

## 2016-01-25 LAB — CBC WITH DIFFERENTIAL/PLATELET
BASOS ABS: 0 10*3/uL (ref 0.0–0.1)
Basophils Relative: 0 %
EOS PCT: 0 %
Eosinophils Absolute: 0.1 10*3/uL (ref 0.0–0.7)
HCT: 41.1 % (ref 39.0–52.0)
Hemoglobin: 14.3 g/dL (ref 13.0–17.0)
Lymphocytes Relative: 12 %
Lymphs Abs: 1.7 10*3/uL (ref 0.7–4.0)
MCH: 31.3 pg (ref 26.0–34.0)
MCHC: 34.8 g/dL (ref 30.0–36.0)
MCV: 89.9 fL (ref 78.0–100.0)
MONO ABS: 0.8 10*3/uL (ref 0.1–1.0)
Monocytes Relative: 6 %
Neutro Abs: 11.1 10*3/uL — ABNORMAL HIGH (ref 1.7–7.7)
Neutrophils Relative %: 82 %
PLATELETS: 225 10*3/uL (ref 150–400)
RBC: 4.57 MIL/uL (ref 4.22–5.81)
RDW: 12.3 % (ref 11.5–15.5)
WBC: 13.6 10*3/uL — AB (ref 4.0–10.5)

## 2016-01-25 LAB — URINALYSIS, ROUTINE W REFLEX MICROSCOPIC
Bilirubin Urine: NEGATIVE
GLUCOSE, UA: NEGATIVE mg/dL
KETONES UR: NEGATIVE mg/dL
Leukocytes, UA: NEGATIVE
Nitrite: NEGATIVE
PROTEIN: NEGATIVE mg/dL
Specific Gravity, Urine: 1.021 (ref 1.005–1.030)
pH: 5.5 (ref 5.0–8.0)

## 2016-01-25 LAB — COMPREHENSIVE METABOLIC PANEL
ALT: 25 U/L (ref 17–63)
AST: 19 U/L (ref 15–41)
Albumin: 3.5 g/dL (ref 3.5–5.0)
Alkaline Phosphatase: 48 U/L (ref 38–126)
Anion gap: 9 (ref 5–15)
BUN: 10 mg/dL (ref 6–20)
CO2: 24 mmol/L (ref 22–32)
CREATININE: 1 mg/dL (ref 0.61–1.24)
Calcium: 9.5 mg/dL (ref 8.9–10.3)
Chloride: 108 mmol/L (ref 101–111)
GFR calc non Af Amer: >60 mL/min (ref >60)
GLUCOSE: 122 mg/dL — AB (ref 65–99)
Potassium: 4 mmol/L (ref 3.5–5.1)
SODIUM: 141 mmol/L (ref 135–145)
Total Bilirubin: 1.2 mg/dL (ref 0.3–1.2)
Total Protein: 6.5 g/dL (ref 6.5–8.1)

## 2016-01-25 LAB — CBC
HCT: 43.1 % (ref 39.0–52.0)
Hemoglobin: 14.9 g/dL (ref 13.0–17.0)
MCH: 31 pg (ref 26.0–34.0)
MCHC: 34.6 g/dL (ref 30.0–36.0)
MCV: 89.8 fL (ref 78.0–100.0)
PLATELETS: 252 10*3/uL (ref 150–400)
RBC: 4.8 MIL/uL (ref 4.22–5.81)
RDW: 12.1 % (ref 11.5–15.5)
WBC: 14.9 10*3/uL — ABNORMAL HIGH (ref 4.0–10.5)

## 2016-01-25 LAB — URINE MICROSCOPIC-ADD ON

## 2016-01-25 LAB — LIPASE, BLOOD: Lipase: 40 U/L (ref 11–51)

## 2016-01-25 MED ORDER — HYDROCODONE-ACETAMINOPHEN 5-325 MG PO TABS: 1.0000 | ORAL_TABLET | Freq: Four times a day (QID) | ORAL | Status: DC | PRN

## 2016-01-25 MED ORDER — IOHEXOL 300 MG/ML  SOLN
100.0000 mL | Freq: Once | INTRAMUSCULAR | Status: AC | PRN
  Administered 2016-01-25: 100 mL via INTRAVENOUS

## 2016-01-25 MED ORDER — ONDANSETRON HCL 4 MG PO TABS: 4.0000 mg | ORAL_TABLET | Freq: Three times a day (TID) | ORAL | Status: DC | PRN

## 2016-01-25 MED ORDER — NAPROXEN 500 MG PO TABS: 500.0000 mg | ORAL_TABLET | Freq: Two times a day (BID) | ORAL | Status: DC

## 2016-01-25 NOTE — ED Notes (Signed)
Pt transported to CT ?

## 2016-01-25 NOTE — ED Notes (Signed)
Pt verbalized understanding of d/c instructions, prescriptions, and follow-up care. No further questions/concerns, VSS, ambulatory w/ steady gait (refused wheelchair) 

## 2016-01-25 NOTE — ED Notes (Signed)
Pt reports onset today of mid lower abd pain that is moving to RLQ. Is nauseated, denies v/d. Has hx of kidneys stones. Abdomen is tender to touch. VSS

## 2016-01-25 NOTE — Discharge Instructions (Signed)
You have been diagnosed with kidney stones.  Use your pain medication as prescribed and do not operate heavy machinery while on this medication. Note that your pain medication contains Acetaminophen (Tylenol), therefore it is not recommended to take additional Tylenol while on your pain medication. Continue to drink fluids to help you pass the stones. Use Zofran for nausea as directed. Follow up with your primary care doctor in regards to your hospital visit.  Return to the ED immediately if you develop fever that persists > 101, uncontrolled pain or vomiting, or other concerns.  Read the instructions below to learn more about kidney stones.   ° °Kidney Stones °Kidney stones (ureteral lithiasis) are solid masses that form inside your kidneys. The intense pain is caused by the stone moving through the kidney, ureter, bladder, and urethra (urinary tract). When the stone moves, the ureter starts to spasm around the stone. The stone is usually passed in the urine.  ° °HOME CARE °· Drink enough fluids to keep your urine clear or pale yellow. This helps to get the stone out.  °· Only take medicine as told by your doctor.  °· Follow up with your doctor as told.  ° °GET HELP RIGHT AWAY IF:  °· Your pain does not get better with medicine.  °· You have a fever.  °· Your pain increases and gets worse over 18 hours.  °· You have new belly (abdominal) pain.  °· You feel faint or pass out.  ° °MAKE SURE YOU:  °· Understand these instructions.  °· Will watch your condition.  °· Will get help right away if you are not doing well or get worse.  °

## 2016-01-25 NOTE — ED Provider Notes (Signed)
CSN: [id number]     Arrival date & time 01/25/16  1324 History   First MD Initiated Contact with Patient 01/25/16 1618     Chief Complaint  Patient presents with  . Abdominal Pain     (Consider location/radiation/quality/duration/timing/severity/associated sxs/prior Treatment) Patient is a 25 y.o. male presenting with abdominal pain. The history is provided by the patient and medical records. No language interpreter was used.  Abdominal Pain Associated symptoms: nausea and vomiting   Associated symptoms: no chills, no constipation, no cough, no diarrhea, no dysuria, no fever, no shortness of breath and no sore throat     Marco Eaton is a 25 y.o. male  with a PMH of kidney stones who presents to the Emergency Department complaining of acute onset of worsening sharp suprapubic pain that began this morning which is now moving to RLQ. Associated symptoms include one episode of emesis, nausea. Denies fever diarrhea. Last BM last night. Tylenol taken PTA with some improvement. Worse with movement. History of kidney stones but states this does not feel the same.    Past Medical History  Diagnosis Date  . Renal disorder   . Kidney stone   . Poor concentration    History reviewed. No pertinent past surgical history. Family History  Problem Relation Age of Onset  . Drug abuse Neg Hx   . Cancer Neg Hx   . Heart attack Neg Hx    Social History  Substance Use Topics  . Smoking status: Current Every Day Smoker -- 1.00 packs/day    Types: Cigarettes    Last Attempt to Quit: 11/10/2012  . Smokeless tobacco: None  . Alcohol Use: 0.0 oz/week    0 Standard drinks or equivalent per week     Comment: Occassional    Review of Systems  Constitutional: Negative for fever and chills.  HENT: Negative for congestion and sore throat.   Eyes: Negative for visual disturbance.  Respiratory: Negative for cough and shortness of breath.   Cardiovascular: Negative.   Gastrointestinal: Positive for  nausea, vomiting and abdominal pain. Negative for diarrhea and constipation.  Genitourinary: Negative for dysuria.  Musculoskeletal: Negative for back pain and neck pain.  Skin: Negative for rash.  Neurological: Negative for dizziness, weakness and headaches.      Allergies  Review of patient's allergies indicates no known allergies.  Home Medications   Prior to Admission medications   Medication Sig Start Date End Date Taking? Authorizing Provider  acetaminophen (TYLENOL) 325 MG tablet Take 650 mg by mouth every 6 (six) hours as needed for mild pain.   Yes Historical Provider, MD  ibuprofen (ADVIL,MOTRIN) 200 MG tablet Take 400 mg by mouth every 6 (six) hours as needed for mild pain.   Yes Historical Provider, MD  HYDROcodone-acetaminophen (NORCO/VICODIN) 5-325 MG tablet Take 1 tablet by mouth every 6 (six) hours as needed for severe pain. 01/25/16   Marco Picket Genola Yuille, PA-C  naproxen (NAPROSYN) 500 MG tablet Take 1 tablet (500 mg total) by mouth 2 (two) times daily. 01/25/16   Marco Picket Oluwatamilore Starnes, PA-C  ondansetron (ZOFRAN) 4 MG tablet Take 1 tablet (4 mg total) by mouth every 8 (eight) hours as needed for nausea or vomiting. 01/25/16   Marco Picket Geno Sydnor, PA-C   BP 115/72 mmHg  Pulse 64  Temp(Src) 97.6 F (36.4 C) (Oral)  Resp 16  SpO2 100% Physical Exam  Constitutional: He is oriented to person, place, and time. He appears well-developed and well-nourished.  Alert and in no acute  distress  HENT:  Head: Normocephalic and atraumatic.  Cardiovascular: Normal rate, regular rhythm and normal heart sounds.  Exam reveals no gallop and no friction rub.   No murmur heard. Pulmonary/Chest: Effort normal and breath sounds normal. No respiratory distress. He has no wheezes. He has no rales.  Abdominal: Soft. Bowel sounds are normal. He exhibits no distension. There is tenderness. There is tenderness at McBurney's point. There is no CVA tenderness.  Negative psoas. Negative Rovsing's.    Musculoskeletal: He exhibits no edema.  Neurological: He is alert and oriented to person, place, and time.  Skin: Skin is warm and dry. No rash noted.  Psychiatric: He has a normal mood and affect. His behavior is normal. Judgment and thought content normal.  Nursing note and vitals reviewed.   ED Course  Procedures (including critical care time) Labs Review Labs Reviewed  COMPREHENSIVE METABOLIC PANEL - Abnormal; Notable for the following:    Glucose, Bld 122 (*)    All other components within normal limits  CBC - Abnormal; Notable for the following:    WBC 14.9 (*)    All other components within normal limits  URINALYSIS, ROUTINE W REFLEX MICROSCOPIC (NOT AT [facility]) - Abnormal; Notable for the following:    Color, Urine AMBER (*)    APPearance CLOUDY (*)    Hgb urine dipstick LARGE (*)    All other components within normal limits  CBC WITH DIFFERENTIAL/PLATELET - Abnormal; Notable for the following:    WBC 13.6 (*)    Neutro Abs 11.1 (*)    All other components within normal limits  URINE MICROSCOPIC-ADD ON - Abnormal; Notable for the following:    Squamous Epithelial / LPF 0-5 (*)    Bacteria, UA FEW (*)    All other components within normal limits  LIPASE, BLOOD    Imaging Review Ct Abdomen Pelvis W Contrast  01/25/2016  CLINICAL DATA:  Right lower quadrant abdomen pain starting today. EXAM: CT ABDOMEN AND PELVIS WITH CONTRAST TECHNIQUE: Multidetector CT imaging of the abdomen and pelvis was performed using the standard protocol following bolus administration of intravenous contrast. CONTRAST:  OMNIPAQUE IOHEXOL 300 MG/ML  SOLN COMPARISON:  March 07, 2014 FINDINGS: The liver is normal. No focal liver lesion is identified. The gallbladder is normal. The spleen and pancreas are normal. There is right hydroureteronephrosis due to obstruction by a 7 mm stone in the distal right ureter. There are tiny nonobstructing stones in both kidneys. There is no left hydronephrosis. The  aorta is normal without aneurysmal dilatation. There is no abdominal lymphadenopathy. There is no small bowel obstruction. The colon is normal. Moderate bowel content is identified throughout colon. The proximal appendix is seen and is normal. Fluid-filled bladder is normal. There is no pelvic lymphadenopathy. The lung bases are clear. No acute abnormalities identified in the visualized bones. Mild decreased intervertebral space is identified L5-S1. IMPRESSION: Right hydroureteronephrosis due to obstruction by a 7 mm stone in the distal right ureter. Proximal appendix is seen and is normal. Electronically Signed   By: Sherian Rein M.D.   On: 01/25/2016 17:56   I have personally reviewed and evaluated these images and lab results as part of my medical decision-making.   EKG Interpretation None      MDM   Final diagnoses:  RLQ abdominal pain  Kidney stone   Reason Helzer presents with acute onset of suprapubic/periumbilical abdominal pain which has now moved to RLQ. On exam, patient is focally tender at McBurney's point.  Negative psoas and rovsing's signs. WBC count of 14.9. Will obtain CT as there is concern for appendicitis. At this time, patient does not want medication for nausea or pain - he was informed to let me or nursing staff know if this changes.   Imaging: CT shows 7mm stone in distal right ureter - given patient's pain was controlled with tylenol, patient will be dc'd to home with pain control, nausea control and given urology follow up. Home care instructions, follow up instructions, and return precautions given. All questions answered.   Patient discussed with Dr. Paris Lore who agrees with treatment plan.   [facility] Anchor Dwan, PA-C 01/25/16 1819  Loren Racer, MD 01/26/16 [phone]

## 2017-01-08 ENCOUNTER — Telehealth: Payer: Self-pay

## 2017-01-08 NOTE — Telephone Encounter (Signed)
Pt called c/o sharp chest pain with inspiration. Pt denies cough/congestion, fever. Pt reports he had costochondritis last year, and this feels similar. Has tried ibuprofen, with some relief. Advised pt to go to ED if he experiences a crushing chest pain, N/V, neck/arm/back pain. Appointment scheduled for tomorrow at 3:45 pm. Allene DillonEmily Drozdowski, CMA

## 2017-01-09 ENCOUNTER — Ambulatory Visit (INDEPENDENT_AMBULATORY_CARE_PROVIDER_SITE_OTHER): Payer: 59 | Admitting: Family Medicine

## 2017-01-09 ENCOUNTER — Encounter: Payer: Self-pay | Admitting: Family Medicine

## 2017-01-09 VITALS — BP 120/80 | HR 87 | Temp 98.0°F | Resp 16 | Wt 194.0 lb

## 2017-01-09 DIAGNOSIS — M940 Chondrocostal junction syndrome [Tietze]: Secondary | ICD-10-CM

## 2017-01-09 MED ORDER — PREDNISONE 10 MG PO TABS: ORAL_TABLET | ORAL | 0 refills | Status: AC

## 2017-01-09 NOTE — Patient Instructions (Signed)
Costochondritis Costochondritis is swelling and irritation (inflammation) of the tissue (cartilage) that connects your ribs to your breastbone (sternum). This causes pain in the front of your chest. The pain usually starts gradually and involves more than one rib. What are the causes? The exact cause of this condition is not always known. It results from stress on the cartilage where your ribs attach to your sternum. The cause of this stress could be:  Chest injury (trauma).  Exercise or activity, such as lifting.  Severe coughing. What increases the risk? You may be at higher risk for this condition if you:  Are male.  Are 30?25 years old.  Recently started a new exercise or work activity.  Have low levels of vitamin D.  Have a condition that makes you cough frequently. What are the signs or symptoms? The main symptom of this condition is chest pain. The pain:  Usually starts gradually and can be sharp or dull.  Gets worse with deep breathing, coughing, or exercise.  Gets better with rest.  May be worse when you press on the sternum-rib connection (tenderness). How is this diagnosed? This condition is diagnosed based on your symptoms, medical history, and a physical exam. Your health care provider will check for tenderness when pressing on your sternum. This is the most important finding. You may also have tests to rule out other causes of chest pain. These may include:  A chest X-ray to check for lung problems.  An electrocardiogram (ECG) to see if you have a heart problem that could be causing the pain.  An imaging scan to rule out a chest or rib fracture. How is this treated? This condition usually goes away on its own over time. Your health care provider may prescribe an NSAID to reduce pain and inflammation. Your health care provider may also suggest that you:  Rest and avoid activities that make pain worse.  Apply heat or cold to the area to reduce pain and  inflammation.  Do exercises to stretch your chest muscles. If these treatments do not help, your health care provider may inject a numbing medicine at the sternum-rib connection to help relieve the pain. Follow these instructions at home:  Avoid activities that make pain worse. This includes any activities that use chest, abdominal, and side muscles.  If directed, put ice on the painful area:  Put ice in a plastic bag.  Place a towel between your skin and the bag.  Leave the ice on for 20 minutes, 2-3 times a day.  If directed, apply heat to the affected area as often as told by your health care provider. Use the heat source that your health care provider recommends, such as a moist heat pack or a heating pad.  Place a towel between your skin and the heat source.  Leave the heat on for 20-30 minutes.  Remove the heat if your skin turns bright red. This is especially important if you are unable to feel pain, heat, or cold. You may have a greater risk of getting burned.  Take over-the-counter and prescription medicines only as told by your health care provider.  Return to your normal activities as told by your health care provider. Ask your health care provider what activities are safe for you.  Keep all follow-up visits as told by your health care provider. This is important. Contact a health care provider if:  You have chills or a fever.  Your pain does not go away or it gets worse.    You have a cough that does not go away (is persistent). Get help right away if:  You have shortness of breath. This information is not intended to replace advice given to you by your health care provider. Make sure you discuss any questions you have with your health care provider. Document Released: 09/06/2005 Document Revised: 06/16/2016 Document Reviewed: 03/22/2016 Elsevier Interactive Patient Education  2017 [employer]  

## 2017-01-09 NOTE — Progress Notes (Signed)
       Patient: Marco Eaton Male    DOB: Dec 26, 1990   26 y.o.   MRN: [id number] Visit Date: 01/09/2017  Today's Provider: Mila Merryonald Fisher, MD   Chief Complaint  Patient presents with  . Chest Pain    x 2 weeks   Subjective:    Chest Pain   This is a new problem. Episode onset: 2 weeks ago. The problem occurs intermittently. The problem has been gradually worsening. Quality: ache. The pain radiates to the mid back, left neck and right neck. Associated symptoms include back pain, exertional chest pressure, malaise/fatigue and palpitations. Pertinent negatives include no abdominal pain, cough (hurts to breath deeply), diaphoresis, dizziness, fever, headaches, hemoptysis, leg pain, lower extremity edema, nausea, near-syncope, numbness, syncope, vomiting or weakness. He has tried NSAIDs for the symptoms. The treatment provided mild relief.   States pain is similar to pain he had last year and as diagnosed with costochondritis.  Statespain is worse when when moving, but alos when lying  Down.  Started about 2 weeks ago. Pain in center of chest worse when taking center of chest     No Known Allergies  No current outpatient prescriptions on file.  Review of Systems  Constitutional: Positive for chills and malaise/fatigue. Negative for appetite change, diaphoresis and fever.  Respiratory: Positive for chest tightness. Negative for cough (hurts to breath deeply), hemoptysis and wheezing.   Cardiovascular: Positive for chest pain and palpitations. Negative for syncope and near-syncope.  Gastrointestinal: Negative for abdominal pain, nausea and vomiting.  Musculoskeletal: Positive for back pain.  Neurological: Negative for dizziness, weakness, numbness and headaches.    Social History  Substance Use Topics  . Smoking status: Current Every Day Smoker    Packs/day: 1.00    Types: E-cigarettes    Last attempt to quit: 11/10/2012  . Smokeless tobacco: Never Used  . Alcohol use No    Objective:   BP 120/80 (BP Location: Right Arm, Patient Position: Sitting, Cuff Size: Large)   Pulse 87   Temp 98 F (36.7 C) (Oral)   Resp 16   Wt 194 lb (88 kg)   SpO2 98% Comment: room air  BMI 27.06 kg/m   Physical Exam  General Appearance:    Alert, cooperative, no distress  Eyes:    PERRL, conjunctiva/corneas clear, EOM's intact       Lungs:     Clear to auscultation bilaterally, respirations unlabored  Heart:    Regular rate and rhythm  Neurologic:   Awake, alert, oriented x 3. No apparent focal neurological           defect.   MS:    Moderate tenderness of sternum. No gross deformities.      Assessment & Plan:     1. Costochondritis  - predniSONE (DELTASONE) 10 MG tablet; 6 tablets for 2 days, then 5 for 2 days, then 4 for 2 days, then 3 for 2 days, then 2 for 2 days, then 1 for 2 days.  Dispense: 42 tablet; Refill: 0  Call if symptoms change or if not rapidly improving.          Mila Merryonald Fisher, MD  [facility]

## 2018-03-29 ENCOUNTER — Ambulatory Visit: Payer: Managed Care, Other (non HMO) | Admitting: Family Medicine

## 2018-03-29 ENCOUNTER — Encounter: Payer: Self-pay | Admitting: Family Medicine

## 2018-03-29 VITALS — BP 102/70 | HR 104 | Temp 98.0°F | Resp 16 | Ht 71.0 in | Wt 195.0 lb

## 2018-03-29 DIAGNOSIS — F40243 Fear of flying: Secondary | ICD-10-CM | POA: Diagnosis not present

## 2018-03-29 DIAGNOSIS — T753XXA Motion sickness, initial encounter: Secondary | ICD-10-CM | POA: Diagnosis not present

## 2018-03-29 MED ORDER — ALPRAZOLAM 0.5 MG PO TABS: ORAL_TABLET | ORAL | 0 refills | Status: DC

## 2018-03-29 NOTE — Patient Instructions (Signed)
   You can take OTC meclizine (a.k.a Less Drowsy Dramamine) 25mg  tablet at start of first flight for motion sickness

## 2018-03-29 NOTE — Progress Notes (Signed)
       Patient: Marco Eaton Marco Eaton    DOB: 09-01-1991   27 y.o.   MRN: [id number] Visit Date: 03/29/2018  Today's Provider: Mila Merryonald Fisher, MD   Chief Complaint  Patient presents with  . Anxiety   Subjective:    HPI  Patient is going on his honeymoon on 04/07/2018. Patient has never flown before and wanted to discuss anxiety medications with Dr. Sherrie MustacheFisher. Also patient wants to discuss motion sickness medication with provider.    No Known Allergies  No current outpatient medications on file.  Review of Systems  Constitutional: Negative for appetite change, chills and fever.  Respiratory: Negative for chest tightness, shortness of breath and wheezing.   Cardiovascular: Negative for chest pain and palpitations.  Gastrointestinal: Negative for abdominal pain, nausea and vomiting.    Social History   Tobacco Use  . Smoking status: Current Every Day Smoker    Packs/day: 1.00    Types: E-cigarettes    Last attempt to quit: 11/10/2012    Years since quitting: 5.3  . Smokeless tobacco: Never Used  Substance Use Topics  . Alcohol use: No    Alcohol/week: 0.0 oz   Objective:   BP 102/70 (BP Location: Right Arm, Patient Position: Sitting, Cuff Size: Large)   Pulse (!) 104   Temp 98 F (36.7 C) (Oral)   Resp 16   Ht 5\' 11"  (1.803 m)   Wt 195 lb (88.5 kg)   SpO2 98%   BMI 27.20 kg/m  Vitals:   03/29/18 1401  BP: 102/70  Pulse: (!) 104  Resp: 16  Temp: 98 F (36.7 C)  TempSrc: Oral  SpO2: 98%  Weight: 195 lb (88.5 kg)  Height: 5\' 11"  (1.803 m)     Physical Exam  General appearance: alert, well developed, well nourished, cooperative and in no distress Head: Normocephalic, without obvious abnormality, atraumatic Respiratory: Respirations even and unlabored, normal respiratory rate Extremities: No gross deformities Skin: Skin color, texture, turgor normal. No rashes seen  Psych: Appropriate mood and affect. Neurologic: Mental status: Alert, oriented to person, place,  and time, thought content appropriate.     Assessment & Plan:     1. Motion sickness Patient Instructions   You can take OTC meclizine (a.k.a Less Drowsy Dramamine) 25mg  tablet at start of first flight for motion sickness    2. Fear of flying  - ALPRAZolam (XANAX) 0.5 MG tablet; Take one tablet 30 minutes before flight, no more than three tablets in a day  Dispense: 6 tablet; Refill: 0       Mila Merryonald Fisher, MD  [facility]

## 2018-10-18 ENCOUNTER — Institutional Professional Consult (permissible substitution): Payer: Self-pay | Admitting: Pulmonary Disease

## 2018-10-31 ENCOUNTER — Ambulatory Visit: Payer: Managed Care, Other (non HMO) | Admitting: Internal Medicine

## 2018-10-31 ENCOUNTER — Encounter: Payer: Self-pay | Admitting: Internal Medicine

## 2018-10-31 VITALS — BP 110/70 | HR 88 | Temp 98.0°F | Ht 70.5 in | Wt 210.3 lb

## 2018-10-31 DIAGNOSIS — R079 Chest pain, unspecified: Secondary | ICD-10-CM | POA: Insufficient documentation

## 2018-10-31 DIAGNOSIS — K219 Gastro-esophageal reflux disease without esophagitis: Secondary | ICD-10-CM | POA: Diagnosis not present

## 2018-10-31 DIAGNOSIS — R071 Chest pain on breathing: Secondary | ICD-10-CM | POA: Diagnosis not present

## 2018-10-31 DIAGNOSIS — F339 Major depressive disorder, recurrent, unspecified: Secondary | ICD-10-CM | POA: Insufficient documentation

## 2018-10-31 MED ORDER — ESCITALOPRAM OXALATE 10 MG PO TABS: 5.0000 mg | ORAL_TABLET | Freq: Every day | ORAL | 1 refills | Status: DC

## 2018-10-31 NOTE — Patient Instructions (Signed)
-  Please schedule follow-up with me in 6 weeks.  -Please schedule appointment for cognitive behavioral therapy with Maggie FontKeith Cottle.  -Will start Lexapro 10 mg daily

## 2018-10-31 NOTE — Progress Notes (Signed)
New Patient Office Visit     CC/Reason for Visit: Subacute chest pain, depressed mood Previous PCP: Dr. Wynelle Link at [facility]   HPI: Marco Eaton is a 27 y.o. male who is coming in today for the above mentioned reasons.  He has no past medical history of significance.  He comes in today with his wife who contributes to history.  He states that his symptoms began about 5 years ago.  He states that usually after a URI he would get chest pains, was diagnosed with costochondritis. He would get steroids and muscle relaxers intermittently and would somewhat improve.  This would happen usually once a year and would resolve without issue.  Lately symptoms have occurred monthly, received a course of steroids at urgent care.  In October 2019 he saw his PCP who sent him for an ultrasound of the abdomen (have reviewed results which shows hepatic steatosis otherwise negative) as well as an echocardiogram (have reviewed results with show a trivial pericardial effusion but is otherwise unremarkable) at that point it was thought that he might have pericarditis and was referred to cardiology.  Cardiologist, also at [facility], did not believe that this was true pericarditis and thought that the pericardial effusion was probably a bystander.  His consultation note comments on the fact that he believes that a large component of his symptoms may be due to depression.  Patient and his wife state that for over 3 months he has had a very depressed mood, has had little interest in doing things, has felt very tired, has had very poor appetite and has been having trouble concentrating.  There are a lot of social stressors including a battle for custody over his young son.  He has 1 child, is married, works in [employer] but has missed numerous days because of symptoms.  Past Medical/Surgical History: Past Medical History:  Diagnosis Date  . Asthma   . Kidney stone   . Poor concentration   . Renal disorder      History reviewed. No pertinent surgical history.  Social History:  reports that he quit smoking about 5 years ago. His smoking use included e-cigarettes. He smoked 1.00 pack per day. He has never used smokeless tobacco. He reports that he does not drink alcohol or use drugs.  Allergies: Allergies  Allergen Reactions  . Cat Hair Extract Cough  . Other Cough    Family History:  Family History  Problem Relation Age of Onset  . Drug abuse Neg Hx   . Cancer Neg Hx   . Heart attack Neg Hx      Current Outpatient Medications:  .  ALPRAZolam (XANAX) 0.5 MG tablet, Take one tablet 30 minutes before flight, no more than three tablets in a day, Disp: 6 tablet, Rfl: 0 .  baclofen (LIORESAL) 10 MG tablet, , Disp: , Rfl:  .  colchicine 0.6 MG tablet, , Disp: , Rfl:  .  HYDROcodone-acetaminophen (NORCO/VICODIN) 5-325 MG tablet, Take by mouth., Disp: , Rfl:  .  ibuprofen (ADVIL,MOTRIN) 200 MG tablet, Take by mouth., Disp: , Rfl:  .  omeprazole (PRILOSEC) 40 MG capsule, , Disp: , Rfl:  .  ondansetron (ZOFRAN) 4 MG tablet, Take by mouth., Disp: , Rfl:  .  escitalopram (LEXAPRO) 10 MG tablet, Take 0.5 tablets (5 mg total) by mouth daily., Disp: 30 tablet, Rfl: 1  Review of Systems:  Constitutional: Denies fever, chills, diaphoresis, appetite change and fatigue.  HEENT: Denies photophobia, eye pain, redness, hearing  loss, ear pain, congestion, sore throat, rhinorrhea, sneezing, mouth sores, trouble swallowing, neck pain, neck stiffness and tinnitus.   Respiratory: Denies SOB, DOE, cough, chest tightness,  and wheezing.   Cardiovascular: Denies chest pain, palpitations and leg swelling.  Gastrointestinal: Denies nausea, vomiting, abdominal pain, diarrhea, constipation, blood in stool and abdominal distention.  Genitourinary: Denies dysuria, urgency, frequency, hematuria, flank pain and difficulty urinating.  Endocrine: Denies: hot or cold intolerance, sweats, changes in hair or nails,  polyuria, polydipsia. Musculoskeletal: Denies myalgias, back pain, joint swelling, arthralgias and gait problem.  Skin: Denies pallor, rash and wound.  Neurological: Denies dizziness, seizures, syncope, weakness, light-headedness, numbness and headaches.  Hematological: Denies adenopathy. Easy bruising, personal or family bleeding history  Psychiatric/Behavioral: Denies suicidal ideation, mood changes, confusion, nervousness, sleep disturbance and agitation    Physical Exam: Vitals:   10/31/18 1631  BP: 110/70  Pulse: 88  Temp: 98 F (36.7 C)  TempSrc: Oral  SpO2: 97%  Weight: 210 lb 4.8 oz (95.4 kg)  Height: 5' 10.5" (1.791 m)   Body mass index is 29.75 kg/m.   Constitutional: NAD, calm, comfortable Eyes: PERRL, lids and conjunctivae normal ENMT: Mucous membranes are moist. Posterior pharynx clear of any exudate or lesions. Normal dentition. Neck: normal, supple, no masses, no thyromegaly Respiratory: clear to auscultation bilaterally, no wheezing, no crackles. Normal respiratory effort. No accessory muscle use.  Cardiovascular: Regular rate and rhythm, no murmurs / rubs / gallops. No extremity edema. 2+ pedal pulses. No carotid bruits.  Abdomen: no tenderness, no masses palpated. No hepatosplenomegaly. Bowel sounds positive.  Musculoskeletal: no clubbing / cyanosis. No joint deformity upper and lower extremities. Good ROM, no contractures. Normal muscle tone.  Skin: no rashes, lesions, ulcers. No induration Psychiatric: Normal judgment and insight. Alert and oriented x 3.  Depressed mood   Impression and Plan:  Depression, recurrent (HCC) -This is been ongoing for over 3 months. -He has a lot of current social stressors including a battle for custody over his young son. -He scored a 23 on PHQ 9. -Will see Maggie FontKeith Cottle for CBT and counseling. -Given his high score on PHQ 9 and length of symptoms, have discussed starting an SSRI with him. Have selected lexapro. -I have  fully disclosed potential side effects including decreased libido.  His wife comments that "he is already there" and thinks highly of trying medications for his depression. -I will see him back in 6 weeks for follow-up.  GERD Chest pain on breathing -After listening to his story and gathering information from his prior records that his wife has brought in including echocardiogram, labs, abdominal ultrasounds (all of these have been scanned into the chart) as well as reviewing [facility], I think it is possible that indeed this started out as a mild pericarditis as all of his symptoms fit: Symptoms starting after upper respiratory infection, worse with laying down and deep breathing, better with sitting up and leaning forward, echocardiogram also had a trivial pericardial effusion.  However he never had a pericardial rub.  And symptoms have persisted past what would be anticipated for simple pericarditis.  His rheumatologist has performed a series of labs that has essentially ruled out a lot of rheumatological conditions, he will nonetheless continue follow-up with him.  He also has a history of GERD and has been on omeprazole for over a year.  He states he has noticed some improvement but not sufficient and is still symptomatic on a daily  basis.  He frequently has reflux while lying down flat, frequent belching and hiccups.  I think it is time for GI referral and consideration of further evaluation.  They already have an appointment with Dr. Matthias Hughs with [facility] for next week. -Unclear what role his social stressors and probable depression are playing in his acute symptomatology, but probably significantly so.        Patient Instructions  -Please schedule follow-up with me in 6 weeks.  -Please schedule appointment for cognitive behavioral therapy with Maggie Font.  -Will start Lexapro 10 mg daily      Philip Aspen,  MD [place]

## 2018-11-05 ENCOUNTER — Encounter: Payer: Self-pay | Admitting: Internal Medicine

## 2018-11-06 ENCOUNTER — Encounter: Payer: Self-pay | Admitting: Internal Medicine

## 2018-11-15 ENCOUNTER — Ambulatory Visit (INDEPENDENT_AMBULATORY_CARE_PROVIDER_SITE_OTHER): Payer: 59 | Admitting: Psychology

## 2018-11-15 DIAGNOSIS — F330 Major depressive disorder, recurrent, mild: Secondary | ICD-10-CM | POA: Diagnosis not present

## 2018-11-18 ENCOUNTER — Encounter: Payer: Self-pay | Admitting: Internal Medicine

## 2018-11-19 ENCOUNTER — Encounter: Payer: Self-pay | Admitting: Internal Medicine

## 2018-11-24 ENCOUNTER — Emergency Department (HOSPITAL_COMMUNITY)
Admission: EM | Admit: 2018-11-24 | Discharge: 2018-11-25 | Disposition: A | Discharge status: Home / Self Care | Payer: Managed Care, Other (non HMO) | Attending: Emergency Medicine | Admitting: Emergency Medicine

## 2018-11-24 ENCOUNTER — Emergency Department (HOSPITAL_COMMUNITY): Payer: Managed Care, Other (non HMO)

## 2018-11-24 ENCOUNTER — Encounter (HOSPITAL_COMMUNITY): Payer: Self-pay | Admitting: Emergency Medicine

## 2018-11-24 ENCOUNTER — Other Ambulatory Visit: Payer: Self-pay

## 2018-11-24 DIAGNOSIS — R079 Chest pain, unspecified: Secondary | ICD-10-CM | POA: Diagnosis not present

## 2018-11-24 DIAGNOSIS — J45909 Unspecified asthma, uncomplicated: Secondary | ICD-10-CM | POA: Insufficient documentation

## 2018-11-24 DIAGNOSIS — Z79899 Other long term (current) drug therapy: Secondary | ICD-10-CM | POA: Diagnosis not present

## 2018-11-24 DIAGNOSIS — Z87891 Personal history of nicotine dependence: Secondary | ICD-10-CM | POA: Diagnosis not present

## 2018-11-24 LAB — BASIC METABOLIC PANEL
Anion gap: 11 (ref 5–15)
BUN: 10 mg/dL (ref 6–20)
CHLORIDE: 103 mmol/L (ref 98–111)
CO2: 23 mmol/L (ref 22–32)
Calcium: 9 mg/dL (ref 8.9–10.3)
Creatinine, Ser: 0.98 mg/dL (ref 0.61–1.24)
GFR calc Af Amer: >60 mL/min (ref >60)
GFR calc non Af Amer: >60 mL/min (ref >60)
GLUCOSE: 134 mg/dL — AB (ref 70–99)
Potassium: 3.7 mmol/L (ref 3.5–5.1)
Sodium: 137 mmol/L (ref 135–145)

## 2018-11-24 LAB — CBC
HCT: 39.3 % (ref 39.0–52.0)
Hemoglobin: 12.4 g/dL — ABNORMAL LOW (ref 13.0–17.0)
MCH: 28.4 pg (ref 26.0–34.0)
MCHC: 31.6 g/dL (ref 30.0–36.0)
MCV: 90.1 fL (ref 80.0–100.0)
Platelets: 363 10*3/uL (ref 150–400)
RBC: 4.36 MIL/uL (ref 4.22–5.81)
RDW: 13.4 % (ref 11.5–15.5)
WBC: 10.5 10*3/uL (ref 4.0–10.5)
nRBC: 0 % (ref 0.0–0.2)

## 2018-11-24 LAB — I-STAT TROPONIN, ED: Troponin i, poc: 0.01 ng/mL (ref 0.00–0.08)

## 2018-11-24 NOTE — ED Triage Notes (Signed)
Pt presents with chest pain x weeks. Diagnosed with pericarditis, has been taking colchicine and naproxen, pain worsened this week. Pt reports feeling feverish and decreased appetite.

## 2018-11-24 NOTE — ED Provider Notes (Signed)
[facility] EMERGENCY DEPARTMENT Provider Note   CSN: [id number] Arrival date & time: 11/24/18  2015     History   Chief Complaint Chief Complaint  Patient presents with  . Chest Pain    HPI Marco Eaton is a 27 y.o. male.  Patient to ED with complaint of chest pain. He has a 5-year long history of similar symptoms, with outpatient evaluations including primary care, cardiology, rheumatology and is pending GI evaluation with endoscopy. He is taking colchicine and Naproxen for pericarditis. His pain had been controlled, but never resolved. He states that today the pain became significantly worse. No fever but her feels chilled. No congestion or cough, nausea or vomiting. The pain is located in what he considers typical at bilateral parasternal areas, radiating to shoulders. It is worse with lying down. No alleviating factors.  The history is provided by the patient and the spouse. No language interpreter was used.  Chest Pain   Associated symptoms include weakness. Pertinent negatives include no abdominal pain, no cough, no fever and no nausea.    Past Medical History:  Diagnosis Date  . Asthma   . Kidney stone   . Poor concentration   . Renal disorder     Patient Active Problem List   Diagnosis Date Noted  . Depression, recurrent (HCC) 10/31/2018  . Chest pain 10/31/2018  . Motion sickness 03/29/2018  . Poor concentration 01/05/2016    History reviewed. No pertinent surgical history.      Home Medications    Prior to Admission medications   Medication Sig Start Date End Date Taking? Authorizing Provider  baclofen (LIORESAL) 10 MG tablet Take 10 mg by mouth 2 (two) times daily as needed for muscle spasms.  09/20/18  Yes [provider]  colchicine 0.6 MG tablet Take 0.6 mg by mouth 2 (two) times daily.  10/02/18  Yes [provider]  escitalopram (LEXAPRO) 10 MG tablet Take 0.5 tablets (5 mg total) by mouth daily. 10/31/18   Yes Philip Aspen, Limmie Patricia, MD  HYDROcodone-acetaminophen (NORCO/VICODIN) 5-325 MG tablet Take 1 tablet by mouth every 4 (four) hours as needed for moderate pain.    Yes [provider]  naproxen sodium (ALEVE) 220 MG tablet Take 220 mg by mouth 2 (two) times daily as needed (for pain).   Yes [provider]  omeprazole (PRILOSEC) 40 MG capsule Take 40 mg by mouth daily.  10/13/18  Yes [provider]  ondansetron (ZOFRAN) 4 MG tablet Take 4 mg by mouth every 8 (eight) hours as needed for nausea or vomiting.    Yes [provider]  ALPRAZolam Prudy Feeler) 0.5 MG tablet Take one tablet 30 minutes before flight, no more than three tablets in a day Patient not taking: Reported on 11/24/2018 03/29/18   Malva Limes, MD    Family History Family History  Problem Relation Age of Onset  . Drug abuse Neg Hx   . Cancer Neg Hx   . Heart attack Neg Hx     Social History Social History   Tobacco Use  . Smoking status: Former Smoker    Packs/day: 1.00    Types: E-cigarettes    Last attempt to quit: 11/10/2012    Years since quitting: 6.0  . Smokeless tobacco: Never Used  Substance Use Topics  . Alcohol use: No    Alcohol/week: 0.0 standard drinks  . Drug use: No     Allergies   Cat hair extract and Other  Review of Systems Review of Systems  Constitutional: Positive for appetite change. Negative for chills and fever.  HENT: Negative.  Negative for congestion.   Respiratory: Negative.  Negative for cough.   Cardiovascular: Positive for chest pain.  Gastrointestinal: Negative.  Negative for abdominal pain and nausea.  Musculoskeletal: Negative.   Skin: Negative.   Neurological: Positive for weakness.     Physical Exam Updated Vital Signs BP 132/86 (BP Location: Right Arm)   Pulse 97   Temp 98.2 F (36.8 C) (Oral)   Resp 18   SpO2 98%   Physical Exam Constitutional:      Appearance: He is well-developed.  HENT:     Head:  Normocephalic.  Neck:     Musculoskeletal: Normal range of motion and neck supple.     Vascular: No carotid bruit.  Cardiovascular:     Rate and Rhythm: Normal rate and regular rhythm.     Heart sounds: Normal heart sounds. No murmur.  Pulmonary:     Effort: Pulmonary effort is normal.     Breath sounds: Normal breath sounds. No wheezing, rhonchi or rales.  Chest:     Chest wall: Tenderness (Mild parasternal chest tenderness.) present.  Abdominal:     General: Bowel sounds are normal.     Palpations: Abdomen is soft.     Tenderness: There is no abdominal tenderness. There is no guarding or rebound.  Musculoskeletal: Normal range of motion.     Right lower leg: No edema.     Left lower leg: No edema.  Skin:    General: Skin is warm and dry.     Findings: No rash.  Neurological:     Mental Status: He is alert and oriented to person, place, and time.  Psychiatric:        Mood and Affect: Mood normal.      ED Treatments / Results  Labs (all labs ordered are listed, but only abnormal results are displayed) Labs Reviewed  BASIC METABOLIC PANEL - Abnormal; Notable for the following components:      Result Value   Glucose, Bld 134 (*)    All other components within normal limits  CBC - Abnormal; Notable for the following components:   Hemoglobin 12.4 (*)    All other components within normal limits  I-STAT TROPONIN, ED    EKG EKG Interpretation  Date/Time:  Sunday November 24 2018 22:36:21 EST Ventricular Rate:  84 PR Interval:    QRS Duration: 86 QT Interval:  343 QTC Calculation: 406 R Axis:   67 Text Interpretation:  Sinus rhythm `inverted t waves inferior/lat,changed from prior Confirmed by Cathren Laine ([id number]) on 11/24/2018 10:58:57 PM   Radiology Dg Chest 2 View  Result Date: 11/24/2018 CLINICAL DATA:  Chest pain EXAM: CHEST - 2 VIEW COMPARISON:  01/05/2016 FINDINGS: Heart and mediastinal contours are within normal limits. No focal opacities or effusions.  No acute bony abnormality. IMPRESSION: No active cardiopulmonary disease. Electronically Signed   By: Charlett Nose M.D.   On: 11/24/2018 22:31    Procedures Procedures (including critical care time)  Medications Ordered in ED Medications - No data to display   Initial Impression / Assessment and Plan / ED Course  I have reviewed the triage vital signs and the nursing notes.  Pertinent labs & imaging results that were available during my care of the patient were reviewed by me and considered in my medical decision making (see chart for details).     Patient to ED  for worsening of his chronic chest pain today. No fever, cough, injury. Chills without fever. Has had chest pain for 5 years, on treatment for pericarditis, ongoing.   Patient and spouse requesting a CT chest for further evaluation, which is done. CT negative for PE, positive for "small pericardial and trace bilateral pleural effusions". Labs are unremarkable, including delta troponin. He has been comfortable on recheck without need for medications in the ED for comfort.   He can be discharged home to outpatient follow up. Patient and family reassured. No further questions.   Final Clinical Impressions(s) / ED Diagnoses   Final diagnoses:  None   1. Nonspecific chest pain  ED Discharge Orders    None       Elpidio AnisUpstill, Sohum Delillo, PA-C 11/25/18 0132    Cathren LaineSteinl, Kevin, MD 11/29/18 [phone]

## 2018-11-25 ENCOUNTER — Encounter: Payer: Self-pay | Admitting: Internal Medicine

## 2018-11-25 ENCOUNTER — Emergency Department (HOSPITAL_COMMUNITY): Payer: Managed Care, Other (non HMO)

## 2018-11-25 ENCOUNTER — Ambulatory Visit: Payer: Self-pay

## 2018-11-25 LAB — I-STAT TROPONIN, ED: Troponin i, poc: 0 ng/mL (ref 0.00–0.08)

## 2018-11-25 MED ORDER — IOPAMIDOL (ISOVUE-370) INJECTION 76%
100.0000 mL | Freq: Once | INTRAVENOUS | Status: AC | PRN
  Administered 2018-11-25: 100 mL via INTRAVENOUS

## 2018-11-25 MED ORDER — IOPAMIDOL (ISOVUE-370) INJECTION 76%
INTRAVENOUS | Status: AC
  Filled 2018-11-25: qty 100

## 2018-11-25 MED ORDER — NAPROXEN SODIUM 220 MG PO TABS: 220.0000 mg | ORAL_TABLET | Freq: Two times a day (BID) | ORAL | 0 refills | Status: AC | PRN

## 2018-11-25 MED ORDER — NAPROXEN 250 MG PO TABS
375.0000 mg | ORAL_TABLET | Freq: Once | ORAL | Status: DC
  Filled 2018-11-25: qty 2

## 2018-11-25 NOTE — Discharge Instructions (Addendum)
Your labs and CT chest are normal. EKG does not appear to have new changes. Results of your tests are included here for your convenience in follow up with your doctor, which is recommended this week. Return here with any new or worsening symptoms.   Results for orders placed or performed during the hospital encounter of 11/24/18  Basic metabolic panel  Result Value Ref Range   Sodium 137 135 - 145 mmol/L   Potassium 3.7 3.5 - 5.1 mmol/L   Chloride 103 98 - 111 mmol/L   CO2 23 22 - 32 mmol/L   Glucose, Bld 134 (H) 70 - 99 mg/dL   BUN 10 6 - 20 mg/dL   Creatinine, Ser 1.610.98 0.61 - 1.24 mg/dL   Calcium 9.0 8.9 - 09.610.3 mg/dL   GFR calc non Af Amer >60 >60 mL/min   GFR calc Af Amer >60 >60 mL/min   Anion gap 11 5 - 15  CBC  Result Value Ref Range   WBC 10.5 4.0 - 10.5 K/uL   RBC 4.36 4.22 - 5.81 MIL/uL   Hemoglobin 12.4 (L) 13.0 - 17.0 g/dL   HCT 04.539.3 40.939.0 - 81.152.0 %   MCV 90.1 80.0 - 100.0 fL   MCH 28.4 26.0 - 34.0 pg   MCHC 31.6 30.0 - 36.0 g/dL   RDW 91.413.4 78.211.5 - 95.615.5 %   Platelets 363 150 - 400 K/uL   nRBC 0.0 0.0 - 0.2 %  I-stat troponin, ED  Result Value Ref Range   Troponin i, poc 0.01 0.00 - 0.08 ng/mL   Comment 3          I-Stat Troponin, ED ([facility])  Result Value Ref Range   Troponin i, poc 0.00 0.00 - 0.08 ng/mL   Comment 3           Dg Chest 2 View  Result Date: 11/24/2018 CLINICAL DATA:  Chest pain EXAM: CHEST - 2 VIEW COMPARISON:  01/05/2016 FINDINGS: Heart and mediastinal contours are within normal limits. No focal opacities or effusions. No acute bony abnormality. IMPRESSION: No active cardiopulmonary disease. Electronically Signed   By: Charlett NoseKevin  Dover M.D.   On: 11/24/2018 22:31   Ct Angio Chest Pe W And/or Wo Contrast  Result Date: 11/25/2018 CLINICAL DATA:  Chest pain.  History of pericarditis. EXAM: CT ANGIOGRAPHY CHEST WITH CONTRAST TECHNIQUE: Multidetector CT imaging of the chest was performed using the standard protocol during bolus administration of  intravenous contrast. Multiplanar CT image reconstructions and MIPs were obtained to evaluate the vascular anatomy. CONTRAST:  100mL ISOVUE-370 IOPAMIDOL (ISOVUE-370) INJECTION 76% COMPARISON:  Chest x-ray from same day. FINDINGS: Cardiovascular: Satisfactory opacification of the pulmonary arteries to the segmental level. No evidence of pulmonary embolism. Normal heart size. Small pericardial effusion. No thoracic aortic aneurysm or dissection. Mediastinum/Nodes: No enlarged mediastinal, hilar, or axillary lymph nodes. Thyroid gland, trachea, and esophagus demonstrate no significant findings. Lungs/Pleura: Trace bilateral pleural effusions with mild bilateral lower lobe subsegmental atelectasis. No consolidation or pneumothorax. No suspicious pulmonary nodule. Upper Abdomen: No acute abnormality. Musculoskeletal: No chest wall abnormality. No acute or significant osseous findings. Review of the MIP images confirms the above findings. IMPRESSION: 1. Small pericardial and trace bilateral pleural effusions. This could be related to serositis, which can be seen with lupus, systemic sclerosis, rheumatoid arthritis, or inflammatory bowel disease. 2.  No evidence of pulmonary embolism. Electronically Signed   By: Obie DredgeWilliam T Derry M.D.   On: 11/25/2018 01:10

## 2018-11-25 NOTE — Telephone Encounter (Signed)
NT called patient because he was requesting an appointment with his PCP via My Chart for chest pain. Pt states that this has been an ongoing problem for 3-5 years. He has been seen by cardiology, GI and others for his symptoms and nobody has been able to say what was happening to him.  He was seen yesterday in the ED and states he went there because he wanted a CT scan. Pt rates pain at 4-5 after taking his medications He states the pain is all over his chest and radiates to his shoulders. He states this episode of chest pain started in August. Pt refused protocol and wanted appointment on Friday. A Friday appointment was scheduled with Dr Caryl NeverBurchette for evaluation of acute symptoms.  Care advice read to patient. Pt verbalized understanding of all instructions.  Reason for Disposition . [1] Chest pain lasts > 5 minutes AND [2] occurred > 3 days ago (72 hours) AND [3] NO chest pain or cardiac symptoms now  Answer Assessment - Initial Assessment Questions 1. LOCATION: "Where does it hurt?"       Around lungs 2. RADIATION: "Does the pain go anywhere else?" (e.g., into neck, jaw, arms, back)     shoulders 3. ONSET: "When did the chest pain begin?" (Minutes, hours or days)      Off and on for 3-5 years  Most recent since augest 4. PATTERN "Does the pain come and go, or has it been constant since it started?"  "Does it get worse with exertion?"      Yes this eposodes since August 5. DURATION: "How long does it last" (e.g., seconds, minutes, hours)     1 week then goes away 6. SEVERITY: "How bad is the pain?"  (e.g., Scale 1-10; mild, moderate, or severe)    - MILD (1-3): doesn't interfere with normal activities     - MODERATE (4-7): interferes with normal activities or awakens from sleep    - SEVERE (8-10): excruciating pain, unable to do any normal activities       4-5 after his medication 7. CARDIAC RISK FACTORS: "Do you have any history of heart problems or risk factors for heart disease?" (e.g.,  prior heart attack, angina; high blood pressure, diabetes, being overweight, high cholesterol, smoking, or strong family history of heart disease)     no 8. PULMONARY RISK FACTORS: "Do you have any history of lung disease?"  (e.g., blood clots in lung, asthma, emphysema, birth control pills)     no 9. CAUSE: "What do you think is causing the chest pain?"     no 10. OTHER SYMPTOMS: "Do you have any other symptoms?" (e.g., dizziness, nausea, vomiting, sweating, fever, difficulty breathing, cough)       Fatigue soreness chills sweats sometimes HA and diarrhea 11. PREGNANCY: "Is there any chance you are pregnant?" "When was your last menstrual period?"       N/A  Protocols used: CHEST PAIN-A-AH

## 2018-11-26 ENCOUNTER — Other Ambulatory Visit: Payer: Self-pay | Admitting: Internal Medicine

## 2018-11-26 DIAGNOSIS — F339 Major depressive disorder, recurrent, unspecified: Secondary | ICD-10-CM

## 2018-11-26 MED ORDER — ESCITALOPRAM OXALATE 10 MG PO TABS: 10.0000 mg | ORAL_TABLET | Freq: Every day | ORAL | 1 refills | Status: DC

## 2018-11-29 ENCOUNTER — Ambulatory Visit (INDEPENDENT_AMBULATORY_CARE_PROVIDER_SITE_OTHER): Payer: Managed Care, Other (non HMO) | Admitting: Family Medicine

## 2018-11-29 ENCOUNTER — Other Ambulatory Visit: Payer: Self-pay

## 2018-11-29 ENCOUNTER — Ambulatory Visit: Payer: 59 | Admitting: Psychology

## 2018-11-29 ENCOUNTER — Encounter: Payer: Self-pay | Admitting: Family Medicine

## 2018-11-29 VITALS — BP 124/84 | HR 72 | Temp 97.7°F | Ht 70.5 in | Wt 203.6 lb

## 2018-11-29 DIAGNOSIS — F330 Major depressive disorder, recurrent, mild: Secondary | ICD-10-CM | POA: Diagnosis not present

## 2018-11-29 DIAGNOSIS — R079 Chest pain, unspecified: Secondary | ICD-10-CM

## 2018-11-29 DIAGNOSIS — I313 Pericardial effusion (noninflammatory): Secondary | ICD-10-CM

## 2018-11-29 DIAGNOSIS — I3139 Other pericardial effusion (noninflammatory): Secondary | ICD-10-CM

## 2018-11-29 NOTE — Patient Instructions (Signed)
Taper off the E-cigarettes as discussed.

## 2018-11-29 NOTE — Progress Notes (Signed)
Subjective:     Patient ID: Marco Eaton, male   DOB: [birth year], 27 y.o.   MRN: [id number]  HPI Patient is seen today for what sounds like a chronic intermittent problem that he has been extensively evaluated for previously which is intermittent chest pain.  He states he has had 4 years of chest pain but especially since September.  Has had more frequent exacerbations.  He describes somewhat bilateral upper chest pain that is worse supine and with deep inhalation and radiates to the back and shoulders.    He has seen both cardiology and rheumatology.  He had echocardiogram which did show some mild pericardial effusion but no evidence of pericarditis.  He also had CT angiogram with no evidence for pulmonary embolism.  Small pulmonary effusion.  Has been prescribed colchicine and naproxen which both seem to help somewhat.  He states that with pain episodes they can last sometimes for weeks.  He estimates that about 50% of time he had pain over the past couple of months.  Denies any fever, chills, appetite or weight changes, chronic cough.  He does consume e-cigarettes and is trying to quit.  He has [facility]  He did have elevated CRP levels but other connective tissue screening, thyroid screening, etc. was all unremarkable.  He is currently pain-free today  He does not describe any symptoms typical of panic disorder  Past Medical History:  Diagnosis Date  . Asthma   . Kidney stone   . Poor concentration   . Renal disorder    History reviewed. No pertinent surgical history.  reports that he quit smoking about 6 years ago. His smoking use included e-cigarettes. He smoked 1.00 pack per day. He has never used smokeless tobacco. He reports that he does not drink alcohol or use drugs. family history is not on file. Allergies  Allergen Reactions  . Cat Hair Extract Cough  . Other Cough     Review of Systems  Constitutional: Negative for  appetite change, chills, fever and unexpected weight change.  Respiratory: Negative for shortness of breath.   Cardiovascular: Positive for chest pain. Negative for palpitations and leg swelling.  Gastrointestinal: Negative for abdominal pain.  Musculoskeletal: Negative for arthralgias.  Skin: Negative for rash.  Neurological: Negative for dizziness and syncope.  Psychiatric/Behavioral: Negative for confusion.       Objective:   Physical Exam Constitutional:      General: He is not in acute distress.    Appearance: He is well-developed. He is not ill-appearing.  Neck:     Thyroid: No thyromegaly.  Cardiovascular:     Rate and Rhythm: Normal rate and regular rhythm.     Heart sounds: Normal heart sounds. No murmur. No gallop.   Pulmonary:     Effort: Pulmonary effort is normal.     Breath sounds: Normal breath sounds. No decreased breath sounds, wheezing or rales.  Musculoskeletal:     Right lower leg: No edema.     Left lower leg: No edema.  Neurological:     Mental Status: He is alert.        Assessment:     Patient describes somewhat chronic intermittent chest pain and he has had objective evidence for small pericardial effusion without pericarditis and very small bilateral pleural effusions.  He had extensive work-up through cardiology and rheumatology unrevealing.    Plan:     -We have again reiterated that he should try to taper off  E cigarettes completely at this time-though not clear these are contributing.  -He has had fairly extensive evaluation for his pericardial effusion as above considering multiple possible etiologies including connective tissue, infectious, endocrine and these have all been unrevealing.  We did not order any further labs today.  -Continue follow-up with primary for any ongoing symptoms  Kristian CoveyBruce W Burchette MD [facility]

## 2018-12-20 ENCOUNTER — Ambulatory Visit: Payer: Managed Care, Other (non HMO) | Admitting: Internal Medicine

## 2018-12-20 ENCOUNTER — Ambulatory Visit: Payer: 59 | Admitting: Psychology

## 2018-12-20 ENCOUNTER — Other Ambulatory Visit: Payer: Self-pay | Admitting: Internal Medicine

## 2018-12-20 DIAGNOSIS — F339 Major depressive disorder, recurrent, unspecified: Secondary | ICD-10-CM

## 2018-12-20 MED ORDER — ESCITALOPRAM OXALATE 10 MG PO TABS: 10.0000 mg | ORAL_TABLET | Freq: Every day | ORAL | 0 refills | Status: DC

## 2018-12-20 NOTE — Telephone Encounter (Signed)
Patient was to follow up.  Patient cancelled appointment for 12/20/2018.  Okay to refill?

## 2018-12-20 NOTE — Addendum Note (Signed)
Addended by: Kern Reap B on: 12/20/2018 04:49 PM   Modules accepted: Orders

## 2018-12-20 NOTE — Telephone Encounter (Signed)
Yes but needs to reschedule appointment.

## 2018-12-23 ENCOUNTER — Ambulatory Visit: Payer: Self-pay | Admitting: Cardiovascular Disease

## 2018-12-27 ENCOUNTER — Ambulatory Visit: Payer: Managed Care, Other (non HMO) | Admitting: Internal Medicine

## 2018-12-27 VITALS — BP 130/80 | HR 88 | Temp 97.4°F | Wt 203.4 lb

## 2018-12-27 DIAGNOSIS — R079 Chest pain, unspecified: Secondary | ICD-10-CM | POA: Diagnosis not present

## 2018-12-27 DIAGNOSIS — F339 Major depressive disorder, recurrent, unspecified: Secondary | ICD-10-CM

## 2018-12-27 MED ORDER — BUPROPION HCL ER (XL) 150 MG PO TB24: 150.0000 mg | ORAL_TABLET | Freq: Every day | ORAL | 3 refills | Status: DC

## 2018-12-27 NOTE — Patient Instructions (Signed)
-  Glad you are feeling  Better!  -Continue counseling sessions with Mr. Marco Eaton.  -Start Wellbutrin 150 mg daily. Keep taking lexapro 10 mg daily.  -Schedule follow up in 8 weeks.   Major Depressive Disorder, Adult Major depressive disorder (MDD) is a mental health condition. MDD often makes you feel sad, hopeless, or helpless. MDD can also cause symptoms in your body. MDD can affect your:  Work.  School.  Relationships.  Other normal activities. MDD can range from mild to very bad. It may occur once (single episode MDD). It can also occur many times (recurrent MDD). The main symptoms of MDD often include:  Feeling sad, depressed, or irritable most of the time.  Loss of interest. MDD symptoms also include:  Sleeping too much or too little.  Eating too much or too little.  A change in your weight.  Feeling tired (fatigue) or having low energy.  Feeling worthless.  Feeling guilty.  Trouble making decisions.  Trouble thinking clearly.  Thoughts of suicide or harming others.  Feeling weak.  Feeling agitated.  Keeping yourself from being around other people (isolation). Follow these instructions at home: Activity  Do these things as told by your doctor: ? Go back to your normal activities. ? Exercise regularly. ? Spend time outdoors. Alcohol  Talk with your doctor about how alcohol can affect your antidepressant medicines.  Do not drink alcohol. Or, limit how much alcohol you drink. ? This means no more than 1 drink a day for nonpregnant women and 2 drinks a day for men. One drink equals one of these:  12 oz of beer.  5 oz of wine.  1 oz of hard liquor. General instructions  Take over-the-counter and prescription medicines only as told by your doctor.  Eat a healthy diet.  Get plenty of sleep.  Find activities that you enjoy. Make time to do them.  Think about joining a support group. Your doctor may be able to suggest a group for you.  Keep  all follow-up visits as told by your doctor. This is important. Where to find more information:  The [employer] on Mental Illness: ? www.nami.org  [employer] of Mental Health: ? http://www.maynard.net/  National Suicide Prevention Lifeline: ? (401) 364-1662. This is free, 24-hour help. Contact a doctor if:  Your symptoms get worse.  You have new symptoms. Get help right away if:  You self-harm.  You see, hear, taste, smell, or feel things that are not present (hallucinate). If you ever feel like you may hurt yourself or others, or have thoughts about taking your own life, get help right away. You can go to your nearest emergency department or call:  Your local emergency services (911 in the [place]  A suicide crisis helpline, such as the National Suicide Prevention Lifeline: ? 616-631-7918. This is open 24 hours a day. This information is not intended to replace advice given to you by your health care provider. Make sure you discuss any questions you have with your health care provider. Document Released: 11/08/2015 Document Revised: 08/13/2016 Document Reviewed: 08/13/2016 Elsevier Interactive Patient Education  2019 [employer].

## 2018-12-27 NOTE — Progress Notes (Signed)
Established Patient Office Visit     CC/Reason for Visit: Follow-up on depression  HPI: Marco Eaton is a 28 y.o. male who is coming in today for the above mentioned reasons. Past Medical History is significant for: Moderate to severe major depression.  I first saw him at the end of November.  At that time he scored a 23 on PHQ 9.  We decided to commence treatment with Lexapro 10 mg daily.  He has been tolerating this well.  Mentions that he has had some improvement in his mood, is "less sad", however he is having some sexual dysfunction.  No issues with libido or obtaining an erection, however has been having difficulty ejaculating.  He denies suicidal ideation.  He has seen Mr. Cottle for counseling sessions twice since he last saw me and has another session scheduled for next week.  He is going to court next week to find out the final decision regarding the custody of his young son.  His chest pains of undetermined etiology have improved significantly.  Since he last saw me he did go to the emergency department once in December.  Lab work and CT scan was done at that time and everything was normal.   Past Medical/Surgical History: Past Medical History:  Diagnosis Date  . Asthma   . Kidney stone   . Poor concentration   . Renal disorder     No past surgical history on file.  Social History:  reports that he quit smoking about 6 years ago. His smoking use included e-cigarettes. He smoked 1.00 pack per day. He has never used smokeless tobacco. He reports that he does not drink alcohol or use drugs.  Allergies: Allergies  Allergen Reactions  . Cat Hair Extract Cough  . Other Cough    Family History:  Family History  Problem Relation Age of Onset  . Drug abuse Neg Hx   . Cancer Neg Hx   . Heart attack Neg Hx      Current Outpatient Medications:  .  baclofen (LIORESAL) 10 MG tablet, Take 10 mg by mouth 2 (two) times daily as needed for muscle spasms. , Disp: , Rfl:    .  colchicine 0.6 MG tablet, Take 0.6 mg by mouth 2 (two) times daily. , Disp: , Rfl:  .  escitalopram (LEXAPRO) 10 MG tablet, Take 1 tablet (10 mg total) by mouth daily., Disp: 90 tablet, Rfl: 0 .  HYDROcodone-acetaminophen (NORCO/VICODIN) 5-325 MG tablet, Take 1 tablet by mouth every 4 (four) hours as needed for moderate pain. , Disp: , Rfl:  .  naproxen sodium (ALEVE) 220 MG tablet, Take 1 tablet (220 mg total) by mouth 2 (two) times daily as needed (for pain)., Disp: 10 tablet, Rfl: 0 .  omeprazole (PRILOSEC) 40 MG capsule, Take 40 mg by mouth daily. , Disp: , Rfl:  .  ondansetron (ZOFRAN) 4 MG tablet, Take 4 mg by mouth every 8 (eight) hours as needed for nausea or vomiting. , Disp: , Rfl:  .  buPROPion (WELLBUTRIN XL) 150 MG 24 hr tablet, Take 1 tablet (150 mg total) by mouth daily., Disp: 30 tablet, Rfl: 3  Review of Systems:  Constitutional: Denies fever, chills, diaphoresis, appetite change and fatigue.  HEENT: Denies photophobia, eye pain, redness, hearing loss, ear pain, congestion, sore throat, rhinorrhea, sneezing, mouth sores, trouble swallowing, neck pain, neck stiffness and tinnitus.   Respiratory: Denies SOB, DOE, cough, chest tightness,  and wheezing.   Cardiovascular: Denies  chest pain, palpitations and leg swelling.  Gastrointestinal: Denies nausea, vomiting, abdominal pain, diarrhea, constipation, blood in stool and abdominal distention.  Genitourinary: Denies dysuria, urgency, frequency, hematuria, flank pain and difficulty urinating.  Endocrine: Denies: hot or cold intolerance, sweats, changes in hair or nails, polyuria, polydipsia. Musculoskeletal: Denies myalgias, back pain, joint swelling, arthralgias and gait problem.  Skin: Denies pallor, rash and wound.  Neurological: Denies dizziness, seizures, syncope, weakness, light-headedness, numbness and headaches.  Hematological: Denies adenopathy. Easy bruising, personal or family bleeding history  Psychiatric/Behavioral:  Denies suicidal ideation, mood changes, confusion, nervousness, sleep disturbance and agitation    Physical Exam: Vitals:   12/27/18 1005  BP: 130/80  Pulse: 88  Temp: (!) 97.4 F (36.3 C)  TempSrc: Oral  SpO2: 97%  Weight: 203 lb 6.4 oz (92.3 kg)    Body mass index is 28.77 kg/m.   Constitutional: NAD, calm, comfortable Eyes: PERRL, lids and conjunctivae normal ENMT: Mucous membranes are moist.  Neck: normal, supple, no masses, no thyromegaly Respiratory: clear to auscultation bilaterally, no wheezing, no crackles. Normal respiratory effort. No accessory muscle use.  Cardiovascular: Regular rate and rhythm, no murmurs / rubs / gallops. No extremity edema. 2+ pedal pulses. No carotid bruits.  Musculoskeletal: no clubbing / cyanosis. No joint deformity upper and lower extremities. Good ROM, no contractures. Normal muscle tone.  Skin: no rashes, lesions, ulcers. No induration Neurologic: CN 2-12 grossly intact. Sensation intact, DTR normal. Strength 5/5 in all 4.  Psychiatric: Normal judgment and insight. Alert and oriented x 3. Normal mood.    Impression and Plan:  Depression, recurrent (HCC) -He is coming in today as a follow-up for depression after starting on Lexapro 8 weeks ago due to elevated PHQ 9 scores. -As described above he has noticed some modest improvement in his mood, has had some sexual dysfunction with Lexapro but does not appear to be causing significant impairment. -He agrees that he is overall improved. -He wants to increase the Lexapro dose. -Instead of increasing Lexapro I have decided to add Wellbutrin to try to mitigate some of the sexual dysfunction caused by SSRIs. -He has been urged to continue counseling sessions with Mr. Jennelle Human. -He will return in 8 weeks for continued follow-up.  Chest pain, unspecified type -Improved, etiology remains undetermined. -Maybe mild pericarditis might be at play given trivial pericardial effusion noted on echo and  CT scan in addition to improvement with NSAIDs and colchicine. -He has seen cardiology and rheumatology extensively in the past. -I suspect his symptoms may be in part due to depression.    Patient Instructions  -Glad you are feeling  Better!  -Continue counseling sessions with Mr. Jennelle Human.  -Start Wellbutrin 150 mg daily. Keep taking lexapro 10 mg daily.  -Schedule follow up in 8 weeks.   Major Depressive Disorder, Adult Major depressive disorder (MDD) is a mental health condition. MDD often makes you feel sad, hopeless, or helpless. MDD can also cause symptoms in your body. MDD can affect your:  Work.  School.  Relationships.  Other normal activities. MDD can range from mild to very bad. It may occur once (single episode MDD). It can also occur many times (recurrent MDD). The main symptoms of MDD often include:  Feeling sad, depressed, or irritable most of the time.  Loss of interest. MDD symptoms also include:  Sleeping too much or too little.  Eating too much or too little.  A change in your weight.  Feeling tired (fatigue) or having low energy.  Feeling  worthless.  Feeling guilty.  Trouble making decisions.  Trouble thinking clearly.  Thoughts of suicide or harming others.  Feeling weak.  Feeling agitated.  Keeping yourself from being around other people (isolation). Follow these instructions at home: Activity  Do these things as told by your doctor: ? Go back to your normal activities. ? Exercise regularly. ? Spend time outdoors. Alcohol  Talk with your doctor about how alcohol can affect your antidepressant medicines.  Do not drink alcohol. Or, limit how much alcohol you drink. ? This means no more than 1 drink a day for nonpregnant women and 2 drinks a day for men. One drink equals one of these:  12 oz of beer.  5 oz of wine.  1 oz of hard liquor. General instructions  Take over-the-counter and prescription medicines only as told  by your doctor.  Eat a healthy diet.  Get plenty of sleep.  Find activities that you enjoy. Make time to do them.  Think about joining a support group. Your doctor may be able to suggest a group for you.  Keep all follow-up visits as told by your doctor. This is important. Where to find more information:  The [employer] on Mental Illness: ? www.nami.org  U.[employer]. [employer] of Mental Health: ? http://www.maynard.net/www.nimh.nih.gov  National Suicide Prevention Lifeline: ? 51819306421-986 771 7690. This is free, 24-hour help. Contact a doctor if:  Your symptoms get worse.  You have new symptoms. Get help right away if:  You self-harm.  You see, hear, taste, smell, or feel things that are not present (hallucinate). If you ever feel like you may hurt yourself or others, or have thoughts about taking your own life, get help right away. You can go to your nearest emergency department or call:  Your local emergency services (911 in the [place]  A suicide crisis helpline, such as the National Suicide Prevention Lifeline: ? (225)868-94661-986 771 7690. This is open 24 hours a day. This information is not intended to replace advice given to you by your health care provider. Make sure you discuss any questions you have with your health care provider. Document Released: 11/08/2015 Document Revised: 08/13/2016 Document Reviewed: 08/13/2016 Elsevier Interactive Patient Education  2019 Elsevier Inc.      Chaya JanEstela Hernandez Acosta, MD [facility]

## 2019-01-03 ENCOUNTER — Ambulatory Visit: Payer: 59 | Admitting: Psychology

## 2019-01-03 DIAGNOSIS — F330 Major depressive disorder, recurrent, mild: Secondary | ICD-10-CM | POA: Diagnosis not present

## 2019-01-18 ENCOUNTER — Other Ambulatory Visit: Payer: Self-pay | Admitting: Internal Medicine

## 2019-01-18 DIAGNOSIS — F339 Major depressive disorder, recurrent, unspecified: Secondary | ICD-10-CM

## 2019-02-28 ENCOUNTER — Ambulatory Visit: Payer: Managed Care, Other (non HMO) | Admitting: Internal Medicine

## 2019-02-28 ENCOUNTER — Other Ambulatory Visit: Payer: Self-pay

## 2019-02-28 ENCOUNTER — Encounter: Payer: Self-pay | Admitting: Internal Medicine

## 2019-02-28 VITALS — BP 130/80 | HR 110 | Temp 98.1°F | Wt 210.6 lb

## 2019-02-28 DIAGNOSIS — F339 Major depressive disorder, recurrent, unspecified: Secondary | ICD-10-CM | POA: Diagnosis not present

## 2019-02-28 NOTE — Patient Instructions (Signed)
-  Glad you are feeling better!  -Continue counseling sessions with [name] at least once every 4-6 weeks.  -Follow up with me in 4-6 months.

## 2019-02-28 NOTE — Progress Notes (Signed)
Established Patient Office Visit     CC/Reason for Visit: depression follow up  HPI: Marco Eaton is a 28 y.o. male who is coming in today for the above mentioned reasons. Past Medical History is significant for: moderate to severe depression. He is now on lexapro 10 and wellbutrin 150. He feels his mood has improved. He seems more upbeat. Sexual side effects have lessened. He has had CBT 3 times soice he first saw me, last time at the end of January. He seems happy with his job. His one concern is that he feels more fatigued. On the weekends he tends to spend most of the day around the house sleeping.   Past Medical/Surgical History: Past Medical History:  Diagnosis Date  . Asthma   . Kidney stone   . Poor concentration   . Renal disorder     No past surgical history on file.  Social History:  reports that he quit smoking about 6 years ago. His smoking use included e-cigarettes. He smoked 1.00 pack per day. He has never used smokeless tobacco. He reports that he does not drink alcohol or use drugs.  Allergies: Allergies  Allergen Reactions  . Cat Hair Extract Cough  . Other Cough    Family History:  Family History  Problem Relation Age of Onset  . Drug abuse Neg Hx   . Cancer Neg Hx   . Heart attack Neg Hx      Current Outpatient Medications:  .  baclofen (LIORESAL) 10 MG tablet, Take 10 mg by mouth 2 (two) times daily as needed for muscle spasms. , Disp: , Rfl:  .  buPROPion (WELLBUTRIN XL) 150 MG 24 hr tablet, TAKE 1 TABLET BY MOUTH EVERY DAY, Disp: 90 tablet, Rfl: 0 .  colchicine 0.6 MG tablet, Take 0.6 mg by mouth 2 (two) times daily. , Disp: , Rfl:  .  escitalopram (LEXAPRO) 10 MG tablet, Take 1 tablet (10 mg total) by mouth daily., Disp: 90 tablet, Rfl: 0 .  HYDROcodone-acetaminophen (NORCO/VICODIN) 5-325 MG tablet, Take 1 tablet by mouth every 4 (four) hours as needed for moderate pain. , Disp: , Rfl:  .  naproxen sodium (ALEVE) 220 MG tablet, Take 1  tablet (220 mg total) by mouth 2 (two) times daily as needed (for pain)., Disp: 10 tablet, Rfl: 0 .  omeprazole (PRILOSEC) 40 MG capsule, Take 40 mg by mouth daily. , Disp: , Rfl:  .  ondansetron (ZOFRAN) 4 MG tablet, Take 4 mg by mouth every 8 (eight) hours as needed for nausea or vomiting. , Disp: , Rfl:   Review of Systems:  Constitutional: Denies fever, chills, diaphoresis, appetite change and fatigue.  HEENT: Denies photophobia, eye pain, redness, hearing loss, ear pain, congestion, sore throat, rhinorrhea, sneezing, mouth sores, trouble swallowing, neck pain, neck stiffness and tinnitus.   Respiratory: Denies SOB, DOE, cough, chest tightness,  and wheezing.   Cardiovascular: Denies chest pain, palpitations and leg swelling.  Gastrointestinal: Denies nausea, vomiting, abdominal pain, diarrhea, constipation, blood in stool and abdominal distention.  Genitourinary: Denies dysuria, urgency, frequency, hematuria, flank pain and difficulty urinating.  Endocrine: Denies: hot or cold intolerance, sweats, changes in hair or nails, polyuria, polydipsia. Musculoskeletal: Denies myalgias, back pain, joint swelling, arthralgias and gait problem.  Skin: Denies pallor, rash and wound.  Neurological: Denies dizziness, seizures, syncope, weakness, light-headedness, numbness and headaches.  Hematological: Denies adenopathy. Easy bruising, personal or family bleeding history  Psychiatric/Behavioral: Denies suicidal ideation, mood changes, confusion, nervousness,  sleep disturbance and agitation    Physical Exam: Vitals:   02/28/19 1353  BP: 130/80  Pulse: (!) 110  Temp: 98.1 F (36.7 C)  TempSrc: Oral  SpO2: 96%  Weight: 210 lb 9.6 oz (95.5 kg)    Body mass index is 29.79 kg/m.   Constitutional: NAD, calm, comfortable Eyes: PERRL, lids and conjunctivae normal ENMT: Mucous membranes are moist.  Respiratory: clear to auscultation bilaterally, no wheezing, no crackles. Normal respiratory  effort. No accessory muscle use.  Cardiovascular: Regular rate and rhythm, no murmurs / rubs / gallops. No extremity edema. 2+ pedal pulses. No carotid bruits.  Musculoskeletal: no clubbing / cyanosis. No joint deformity upper and lower extremities. Good ROM, no contractures. Normal muscle tone.  Psychiatric: Normal judgment and insight. Alert and oriented x 3. Normal mood.    Impression and Plan:  Depression, recurrent (HCC) -Mood is improved: PHQ 10 from 23 before. -Advised continued CBT sessions. -Continue lexapro and wellbutrin.   Patient Instructions  -Glad you are feeling better!  -Continue counseling sessions with [name] at least once every 4-6 weeks.  -Follow up with me in 4-6 months.     Chaya Jan, MD [facility] at [facility]

## 2019-04-01 ENCOUNTER — Other Ambulatory Visit: Payer: Self-pay | Admitting: Internal Medicine

## 2019-04-01 ENCOUNTER — Encounter: Payer: Self-pay | Admitting: Internal Medicine

## 2019-04-01 DIAGNOSIS — F339 Major depressive disorder, recurrent, unspecified: Secondary | ICD-10-CM

## 2019-04-01 MED ORDER — ESCITALOPRAM OXALATE 10 MG PO TABS: 10.0000 mg | ORAL_TABLET | Freq: Every day | ORAL | 0 refills | Status: DC

## 2019-04-24 ENCOUNTER — Other Ambulatory Visit: Payer: Self-pay | Admitting: Internal Medicine

## 2019-04-24 ENCOUNTER — Encounter: Payer: Self-pay | Admitting: Internal Medicine

## 2019-04-24 DIAGNOSIS — F339 Major depressive disorder, recurrent, unspecified: Secondary | ICD-10-CM

## 2019-04-24 MED ORDER — BUPROPION HCL ER (XL) 150 MG PO TB24: 150.0000 mg | ORAL_TABLET | Freq: Every day | ORAL | 0 refills | Status: DC

## 2019-06-23 ENCOUNTER — Other Ambulatory Visit: Payer: Self-pay | Admitting: Internal Medicine

## 2019-06-23 DIAGNOSIS — F339 Major depressive disorder, recurrent, unspecified: Secondary | ICD-10-CM

## 2019-07-01 ENCOUNTER — Telehealth: Payer: Self-pay | Admitting: *Deleted

## 2019-07-01 NOTE — Telephone Encounter (Signed)
Copied from [place] [id number]. Topic: Appointment Scheduling - Scheduling Inquiry for Clinic >> Jul 01, 2019  3:03 PM Yvette Rack wrote: Reason for CRM: Pt wife called in to schedule an appt for medication refill. Attempted to transfer to the office but the line remained busy. Pt wife would like a call back to schedule an appt. Pt requests appt to be after 3:45 pm.

## 2019-07-02 ENCOUNTER — Other Ambulatory Visit: Payer: Self-pay

## 2019-07-02 DIAGNOSIS — Z20822 Contact with and (suspected) exposure to covid-19: Secondary | ICD-10-CM

## 2019-07-02 NOTE — Telephone Encounter (Signed)
Appointment scheduled.

## 2019-07-06 LAB — NOVEL CORONAVIRUS, NAA: SARS-CoV-2, NAA: NOT DETECTED

## 2019-07-08 ENCOUNTER — Encounter: Payer: Self-pay | Admitting: Internal Medicine

## 2019-07-08 ENCOUNTER — Other Ambulatory Visit: Payer: Self-pay

## 2019-07-08 ENCOUNTER — Ambulatory Visit: Payer: BC Managed Care – PPO | Admitting: Internal Medicine

## 2019-07-08 VITALS — BP 130/90 | HR 87 | Temp 98.6°F | Wt 215.6 lb

## 2019-07-08 DIAGNOSIS — T753XXA Motion sickness, initial encounter: Secondary | ICD-10-CM | POA: Diagnosis not present

## 2019-07-08 DIAGNOSIS — E669 Obesity, unspecified: Secondary | ICD-10-CM | POA: Diagnosis not present

## 2019-07-08 DIAGNOSIS — F40243 Fear of flying: Secondary | ICD-10-CM | POA: Diagnosis not present

## 2019-07-08 DIAGNOSIS — F339 Major depressive disorder, recurrent, unspecified: Secondary | ICD-10-CM

## 2019-07-08 MED ORDER — ESCITALOPRAM OXALATE 10 MG PO TABS: 10.0000 mg | ORAL_TABLET | Freq: Every day | ORAL | 1 refills | Status: DC

## 2019-07-08 MED ORDER — BUPROPION HCL ER (XL) 150 MG PO TB24: 150.0000 mg | ORAL_TABLET | Freq: Every day | ORAL | 1 refills | Status: DC

## 2019-07-08 MED ORDER — ALPRAZOLAM 0.25 MG PO TABS: 0.2500 mg | ORAL_TABLET | Freq: Once | ORAL | 0 refills | Status: DC | PRN

## 2019-07-08 NOTE — Progress Notes (Signed)
Established Patient Office Visit     CC/Reason for Visit: Follow up depression, anxiety with flying  HPI: Marco Eaton is a 28 y.o. male who is coming in today for the above mentioned reasons. Past Medical History is significant for: Depression. His mood is much better controlled on lexapro 10 and wellbutrin 150. He has stopped going to CBT sessions for now. He is flying for a business trip next week and gets anxiety and motion sickness. Has used xanax in the past for this purpose with success. Other than this, he is just wanted wanting general precautions for travelling during COVID-19.   Past Medical/Surgical History: Past Medical History:  Diagnosis Date  . Asthma   . Kidney stone   . Poor concentration   . Renal disorder     No past surgical history on file.  Social History:  reports that he quit smoking about 6 years ago. His smoking use included e-cigarettes. He smoked 1.00 pack per day. He has never used smokeless tobacco. He reports that he does not drink alcohol or use drugs.  Allergies: Allergies  Allergen Reactions  . Cat Hair Extract Cough  . Other Cough    Family History:  Family History  Problem Relation Age of Onset  . Drug abuse Neg Hx   . Cancer Neg Hx   . Heart attack Neg Hx      Current Outpatient Medications:  .  baclofen (LIORESAL) 10 MG tablet, Take 10 mg by mouth 2 (two) times daily as needed for muscle spasms. , Disp: , Rfl:  .  buPROPion (WELLBUTRIN XL) 150 MG 24 hr tablet, Take 1 tablet (150 mg total) by mouth daily., Disp: 90 tablet, Rfl: 1 .  colchicine 0.6 MG tablet, Take 0.6 mg by mouth 2 (two) times daily. , Disp: , Rfl:  .  escitalopram (LEXAPRO) 10 MG tablet, Take 1 tablet (10 mg total) by mouth daily., Disp: 90 tablet, Rfl: 1 .  HYDROcodone-acetaminophen (NORCO/VICODIN) 5-325 MG tablet, Take 1 tablet by mouth every 4 (four) hours as needed for moderate pain. , Disp: , Rfl:  .  naproxen sodium (ALEVE) 220 MG tablet, Take 1  tablet (220 mg total) by mouth 2 (two) times daily as needed (for pain)., Disp: 10 tablet, Rfl: 0 .  omeprazole (PRILOSEC) 40 MG capsule, Take 40 mg by mouth daily. , Disp: , Rfl:  .  ondansetron (ZOFRAN) 4 MG tablet, Take 4 mg by mouth every 8 (eight) hours as needed for nausea or vomiting. , Disp: , Rfl:  .  ALPRAZolam (XANAX) 0.25 MG tablet, Take 1 tablet (0.25 mg total) by mouth once as needed for up to 4 doses for anxiety (Take 1 tablet as needed 30 minutes before airplane ride)., Disp: 4 tablet, Rfl: 0  Review of Systems:  Constitutional: Denies fever, chills, diaphoresis, appetite change and fatigue.  HEENT: Denies photophobia, eye pain, redness, hearing loss, ear pain, congestion, sore throat, rhinorrhea, sneezing, mouth sores, trouble swallowing, neck pain, neck stiffness and tinnitus.   Respiratory: Denies SOB, DOE, cough, chest tightness,  and wheezing.   Cardiovascular: Denies chest pain, palpitations and leg swelling.  Gastrointestinal: Denies nausea, vomiting, abdominal pain, diarrhea, constipation, blood in stool and abdominal distention.  Genitourinary: Denies dysuria, urgency, frequency, hematuria, flank pain and difficulty urinating.  Endocrine: Denies: hot or cold intolerance, sweats, changes in hair or nails, polyuria, polydipsia. Musculoskeletal: Denies myalgias, back pain, joint swelling, arthralgias and gait problem.  Skin: Denies pallor, rash and wound.  Neurological: Denies dizziness, seizures, syncope, weakness, light-headedness, numbness and headaches.  Hematological: Denies adenopathy. Easy bruising, personal or family bleeding history  Psychiatric/Behavioral: Denies suicidal ideation, mood changes, confusion, nervousness, sleep disturbance and agitation    Physical Exam: Vitals:   07/08/19 1602  BP: 130/90  Pulse: 87  Temp: 98.6 F (37 C)  TempSrc: Oral  SpO2: 97%  Weight: 215 lb 9.6 oz (97.8 kg)    Body mass index is 30.5 kg/m.   Constitutional: NAD,  calm, comfortable Eyes: PERRL, lids and conjunctivae normal ENMT: Mucous membranes are moist.  Respiratory: clear to auscultation bilaterally, no wheezing, no crackles. Normal respiratory effort. No accessory muscle use.  Cardiovascular: Regular rate and rhythm, no murmurs / rubs / gallops. No extremity edema. 2+ pedal pulses. No carotid bruits.  Abdomen: no tenderness, no masses palpated. No hepatosplenomegaly. Bowel sounds positive.  Musculoskeletal: no clubbing / cyanosis. No joint deformity upper and lower extremities. Good ROM, no contractures. Normal muscle tone.  Skin: no rashes, lesions, ulcers. No induration Neurologic: grossly intact and nonfocal Psychiatric: Normal judgment and insight. Alert and oriented x 3. Normal mood.    Impression and Plan:  Motion sickness, initial encounter Anxiety with flying  -He has used xanax in the past with success. -Will Rx 0.25 mg 4 tabs total to take 30 mins before getting on the airplane.  Depression, recurrent [facility](HCC)  Dep[facility] 07/08/2019 02/28/2019 12/27/2018  Decreased Interest 0 1 1  Down, Depressed, Hopeless 1 1 1   PHQ - 2 Score 1 2 2   Altered sleeping 0 3 1  Tired, decreased energy 0 3 1  Change in appetite 1 0 0  Feeling bad or failure about yourself  1 0 0  Trouble concentrating 1 1 1   Moving slowly or fidgety/restless 0 1 0  Suicidal thoughts 0 0 0  PHQ-9 Score 4 10 5   Difficult doing work/chores Not difficult at all - Not difficult at all   -Mood has improved: PHQ-9 down to 4 from 23 before. -Continue Wellbutrin and Lexapro. He is requesting 90 day refills as opposed to 30 day.  Obesity (BMI 30.0-34.9) -Discussed healthy lifestyle, including increased physical activity and better food choices to promote weight loss.    Patient Instructions  -Nice seeing you today!!  -Xanax 1 tablet 30 minutes before flying.  -Schedule follow up in 6 months.     Chaya JanEstela Hernandez Acosta, MD [facility]  [facility]

## 2019-07-08 NOTE — Patient Instructions (Signed)
-  Nice seeing you today!!  -Xanax 1 tablet 30 minutes before flying.  -Schedule follow up in 6 months.

## 2019-08-29 ENCOUNTER — Other Ambulatory Visit: Payer: Self-pay | Admitting: Internal Medicine

## 2019-08-29 DIAGNOSIS — F40243 Fear of flying: Secondary | ICD-10-CM

## 2019-08-29 MED ORDER — ALPRAZOLAM 0.25 MG PO TABS: 0.2500 mg | ORAL_TABLET | Freq: Once | ORAL | 0 refills | Status: DC | PRN

## 2019-08-29 NOTE — Telephone Encounter (Signed)
Please see request

## 2019-08-29 NOTE — Telephone Encounter (Signed)
Patient calling back in regards to this request. Patient is requesting a call back, today, if possible.

## 2019-08-29 NOTE — Telephone Encounter (Signed)
Copied from [place] [id number]. Topic: Quick Communication - Rx Refill/Question >> Aug 29, 2019  1:14 PM Erick Blinks wrote: Pt called in regards to recent refill request for generic xanax, please advise. Pt flies Sunday and needs it today if possible

## 2019-12-15 ENCOUNTER — Ambulatory Visit (INDEPENDENT_AMBULATORY_CARE_PROVIDER_SITE_OTHER): Payer: BC Managed Care – PPO | Admitting: Internal Medicine

## 2019-12-15 ENCOUNTER — Other Ambulatory Visit: Payer: Self-pay

## 2019-12-15 ENCOUNTER — Encounter: Payer: Self-pay | Admitting: Internal Medicine

## 2019-12-15 VITALS — BP 124/80 | HR 84 | Temp 97.2°F | Ht 71.0 in | Wt 215.6 lb

## 2019-12-15 DIAGNOSIS — R079 Chest pain, unspecified: Secondary | ICD-10-CM

## 2019-12-15 DIAGNOSIS — R0609 Other forms of dyspnea: Secondary | ICD-10-CM

## 2019-12-15 DIAGNOSIS — R0600 Dyspnea, unspecified: Secondary | ICD-10-CM | POA: Diagnosis not present

## 2019-12-15 NOTE — Progress Notes (Signed)
Marco Eaton    [id number]    1991-01-16  Primary Care Physician:Hernandez Priscella Mann, MD  Referring Physician: Merryl Hacker, NP [address] [place],  [place] [address] Reason for Consultation: chest pain Date of Consultation: 12/15/2019  Chief complaint:   Chief Complaint  Patient presents with  . Pulmonary Consult    Reffered for [place]. Patient reports chest and mid back pain that increases when he lays down. He reports sob with exertion and this also causes chest pain.      HPI:  Chest pain started when he was [age] He describes neck shoulder and back tightness as well as his anterior chest which lasts all day. Chest pain relieved by ibuprofen and pain medication. Worse as the day goes on.  Comes and goes at different times of the year - worse in the winter with cold air.   Dyspnea is with exertion which has been going on since he was 20 as well. Worse over the last 3 years.  Harder to bend over in the last 3 years. He limits activity like running and playing basketball. He says he is scared to play basketball.  Shortness of breath is when he lays down and with exertion. Relieved with slowing down his breathing and get in a comfortable position and posture. No cough or wheezing. Denies itchy watery eyes, runny nose, sore throat.   Played sports growing up and didn't have any difficulty growing up. No childhood respiratory infections.  Had an ER visit at age 71 for "pneumonia" - was sent home.   Had ED visit in Dec 2019 was found to have pericardial effusion and had the following recommendation but never followed up -  Per cardiology - Dr. Molly Maduro in [place]  Plan:   I doubt this is pericarditis. However the clinical course of this has been clouded by the use of steroids. Trivial pericardial effusion is probably a bystander. That said if he has pericarditis treatment with the nonsteroidals 7 days concurrently with colchicine twice  daily for 6 months. His clinical syndrome suggest a different etiology perhaps a systemic inflammatory state. He is currently under evaluation with sophisticated blood studies ordered by rheumatology. He should probably continue colchicine and nonsteroidals until his blood work is back. I will repeat an echocardiogram in 3 months. 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 Has not taken any inhalers.   Was sent here from pain management specialist for further evaluation  Social history:  Occupation: Works in [employer]: Lives at home with his wife - son is with him every other weekend. No pets.  Smoking history: Cigarettes and vaping last used about a year ago.  Rolls his own marijuana - regular daily use.   Social History   Occupational History    Comment: Part time- [employer]  Tobacco Use  . Smoking status: Former Smoker    Packs/day: 1.00    Years: 10.00    Pack years: 10.00    Types: E-cigarettes    Quit date: 11/10/2012    Years since quitting: 7.0  . Smokeless tobacco: Never Used  Substance and Sexual Activity  . Alcohol use: No    Alcohol/week:  0.0 standard drinks  . Drug use: Yes    Types: Marijuana    Comment: pt. states occ. "here and there"  . Sexual activity: Not on file    Relevant family history:  Family History  Problem Relation Age of Onset  . Drug abuse Neg Hx   . Cancer Neg Hx   . Heart attack Neg Hx     Past Medical History:  Diagnosis Date  . Asthma   . Kidney stone   . Poor concentration   . Renal disorder    History reviewed. No pertinent surgical history.  Review of systems: Review of Systems  Constitutional: Negative for chills, fever and weight loss.  HENT: Negative for congestion, sinus pain and sore throat.     Eyes: Negative for discharge and redness.  Respiratory: Positive for shortness of breath. Negative for cough, hemoptysis, sputum production and wheezing.   Cardiovascular: Positive for chest pain. Negative for palpitations and leg swelling.  Gastrointestinal: Positive for heartburn. Negative for nausea and vomiting.       Difficulty swallowing  Musculoskeletal: Negative for joint pain and myalgias.  Skin: Negative for rash.  Neurological: Positive for headaches. Negative for dizziness, tremors and focal weakness.  Endo/Heme/Allergies: Negative for environmental allergies.  Psychiatric/Behavioral: Positive for depression. The patient is nervous/anxious.   All other systems reviewed and are negative.   Physical Exam: Blood pressure 124/80, pulse 84, temperature (!) 97.2 F (36.2 C), temperature source Temporal, height 5\' 11"  (1.803 m), weight 215 lb 9.6 oz (97.8 kg), SpO2 100 %. Gen:      Clammy and anxious  Eyes: EOMI, sclera anicteric ENT:  no nasal polyps, mucus membranes moist Neck:     Supple, no thyromegaly Lungs:    No increased respiratory effort, symmetric chest wall excursion, clear to auscultation bilaterally, no wheezes or crackles CV:         Regular rate and rhythm; no murmurs, rubs, or gallops.  No pedal edema Abd:      + bowel sounds; soft, non-tender; no distension MSK: no acute synovitis of DIP or PIP joints, no mechanics hands.  Skin:      Warm and dry; no rashes Neuro: normal speech, no focal facial asymmetry Psych: alert and oriented x3, normal mood and affect  Data Reviewed: Imaging: I have personally reviewed the CT Angio done Dec 2019 There is a small pericardial effusion but no PE, no pneumonia, no airways disease.   Immunization status: Immunization History  Administered Date(s) Administered  . Influenza,inj,Quad PF,6+ Mos 05/15/2019  . Tdap 01/31/2013, 05/25/2015   Assessment:  Chest pain Shortness of Breath Anxiety and  Depression  Plan/Recommendations: And has longstanding chest pain and shortness of breath for the last 10 years.  He feels that the symptoms have gotten worse over the last 3 years, and are not debilitating up to the point that he can no longer work in his role as an 05/27/2015.  He has been treated for some time now with a pain specialist with opioids and NSAIDs for chronic chest pain.  The sensation he describes a chest pain which starts in his back neck and shoulders and radiates to his anterior chest is certainly not typical for any kind of pulmonary disease.  I would be worried about stress and tension with that distribution, and I am concerned about inadequately treated anxiety and depression.  We discussed the hazards of habitual marijuana use including paradoxical worsening of anxiety and depression.  He is not currently seeing  a counselor, although I do think this would be important in managing his symptoms.  His CT angio and physical exam as well as extensive laboratory evaluation is not revealing for a pulmonary cause of his symptoms.  I do not think an inhaler would be helpful for his symptoms.  Paradoxically albuterol as needed may actually worsen his anxiety.  He was supposed to follow-up with cardiology in [place] with a repeat echocardiogram to ensure resolution, although even at that time his symptoms were data to be related to pericarditis.  I am happy to see him back if the clinical situation changes, and have given him my card to do so.  I spent 46 minutes on 12/15/2019 in care of this patient including face to face time and non-face to face time spent charting, review of outside records, and coordination of care.  Return to Care: PRN  Lenice Llamas, MD [facility]  CC: Jeanella Anton, NP

## 2020-01-08 ENCOUNTER — Other Ambulatory Visit: Payer: Self-pay | Admitting: Internal Medicine

## 2020-01-08 DIAGNOSIS — F339 Major depressive disorder, recurrent, unspecified: Secondary | ICD-10-CM

## 2020-01-21 ENCOUNTER — Other Ambulatory Visit: Payer: Self-pay | Admitting: Internal Medicine

## 2020-01-21 DIAGNOSIS — F339 Major depressive disorder, recurrent, unspecified: Secondary | ICD-10-CM

## 2020-01-23 ENCOUNTER — Other Ambulatory Visit: Payer: Self-pay | Admitting: Internal Medicine

## 2020-01-23 DIAGNOSIS — F339 Major depressive disorder, recurrent, unspecified: Secondary | ICD-10-CM

## 2020-03-03 IMAGING — DX DG CHEST 2V
2 series · 2 of 2 positions shown · non-contrast
Comparison: 01/05/2016

CLINICAL DATA: Chest pain

EXAM:
CHEST - 2 VIEW

[chest pa]
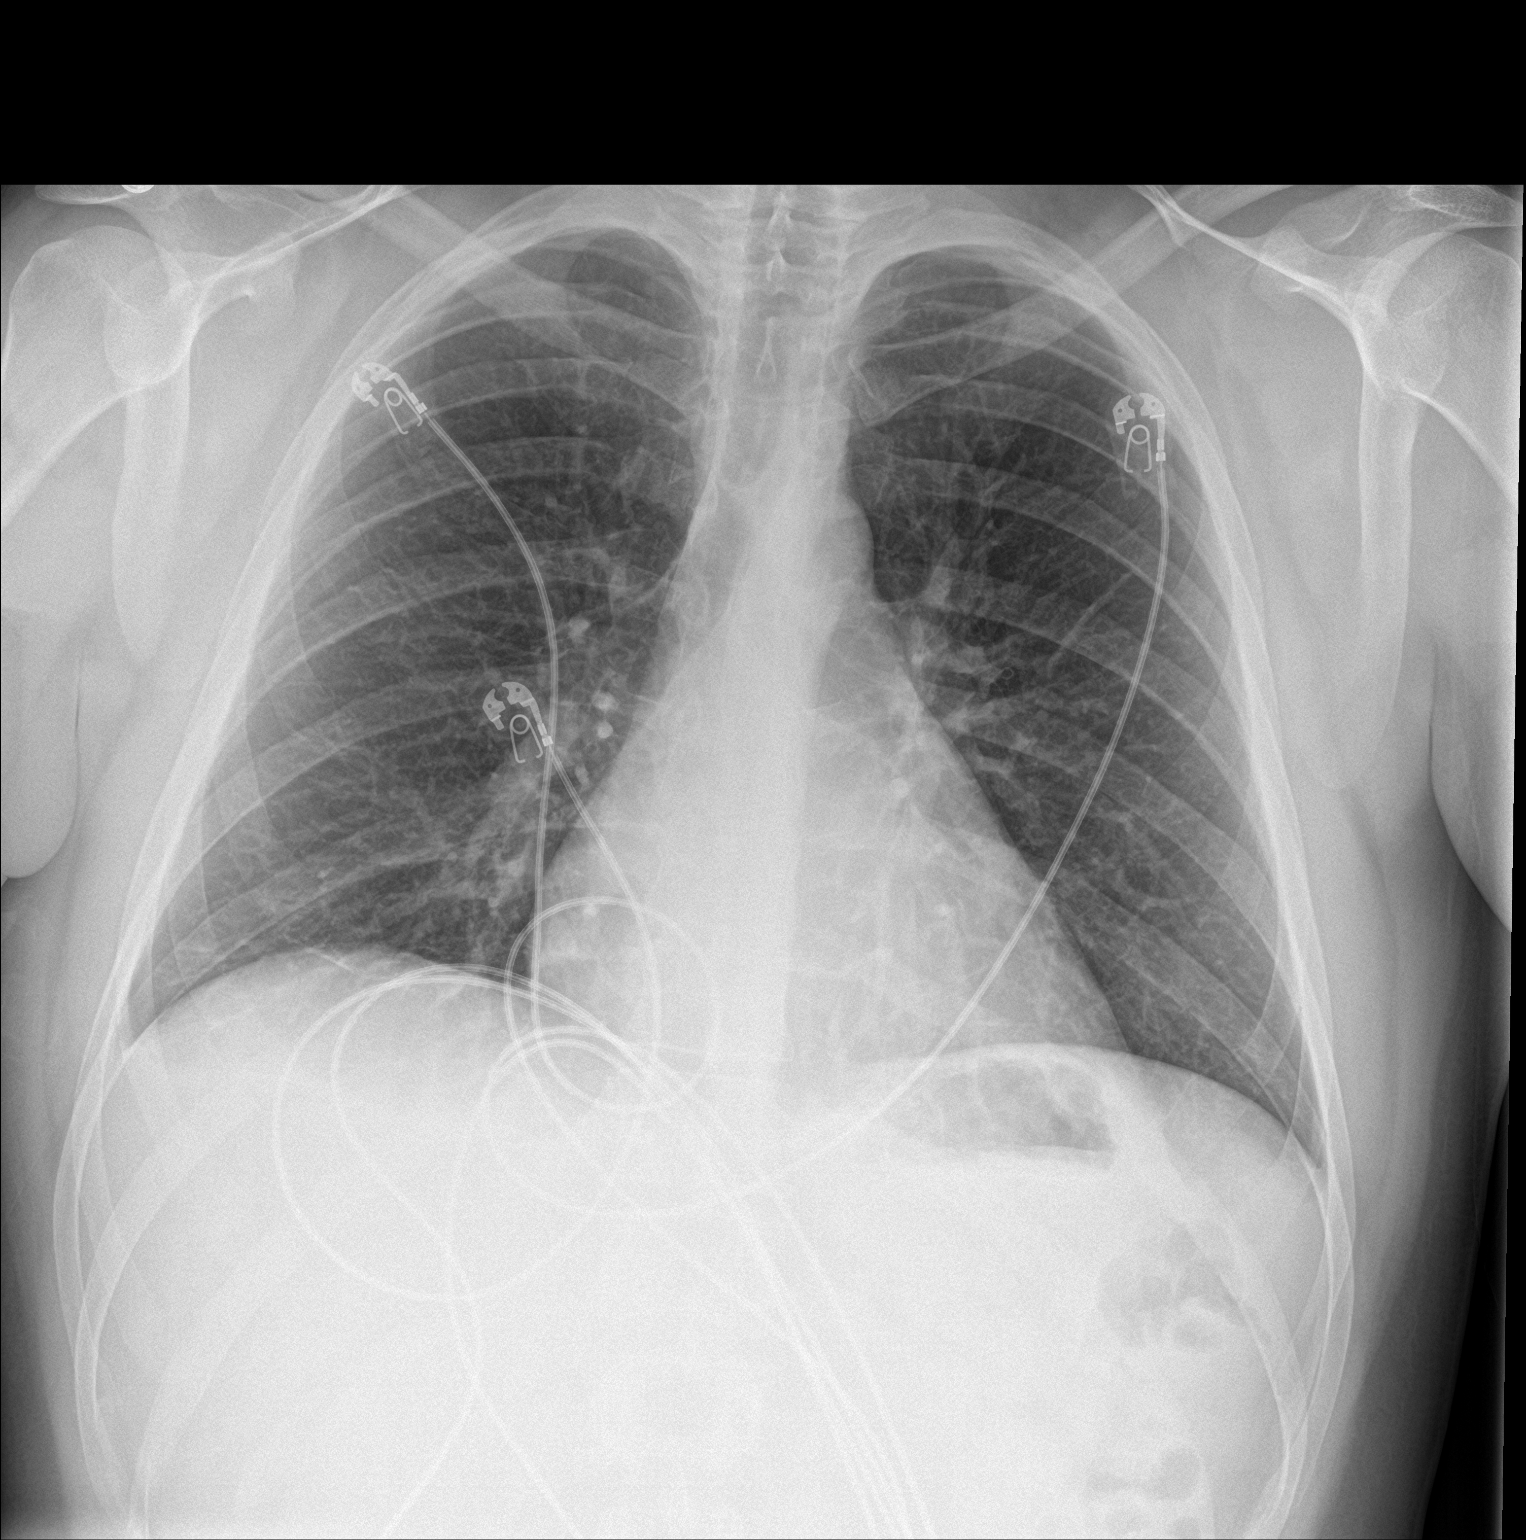

[chest lat]
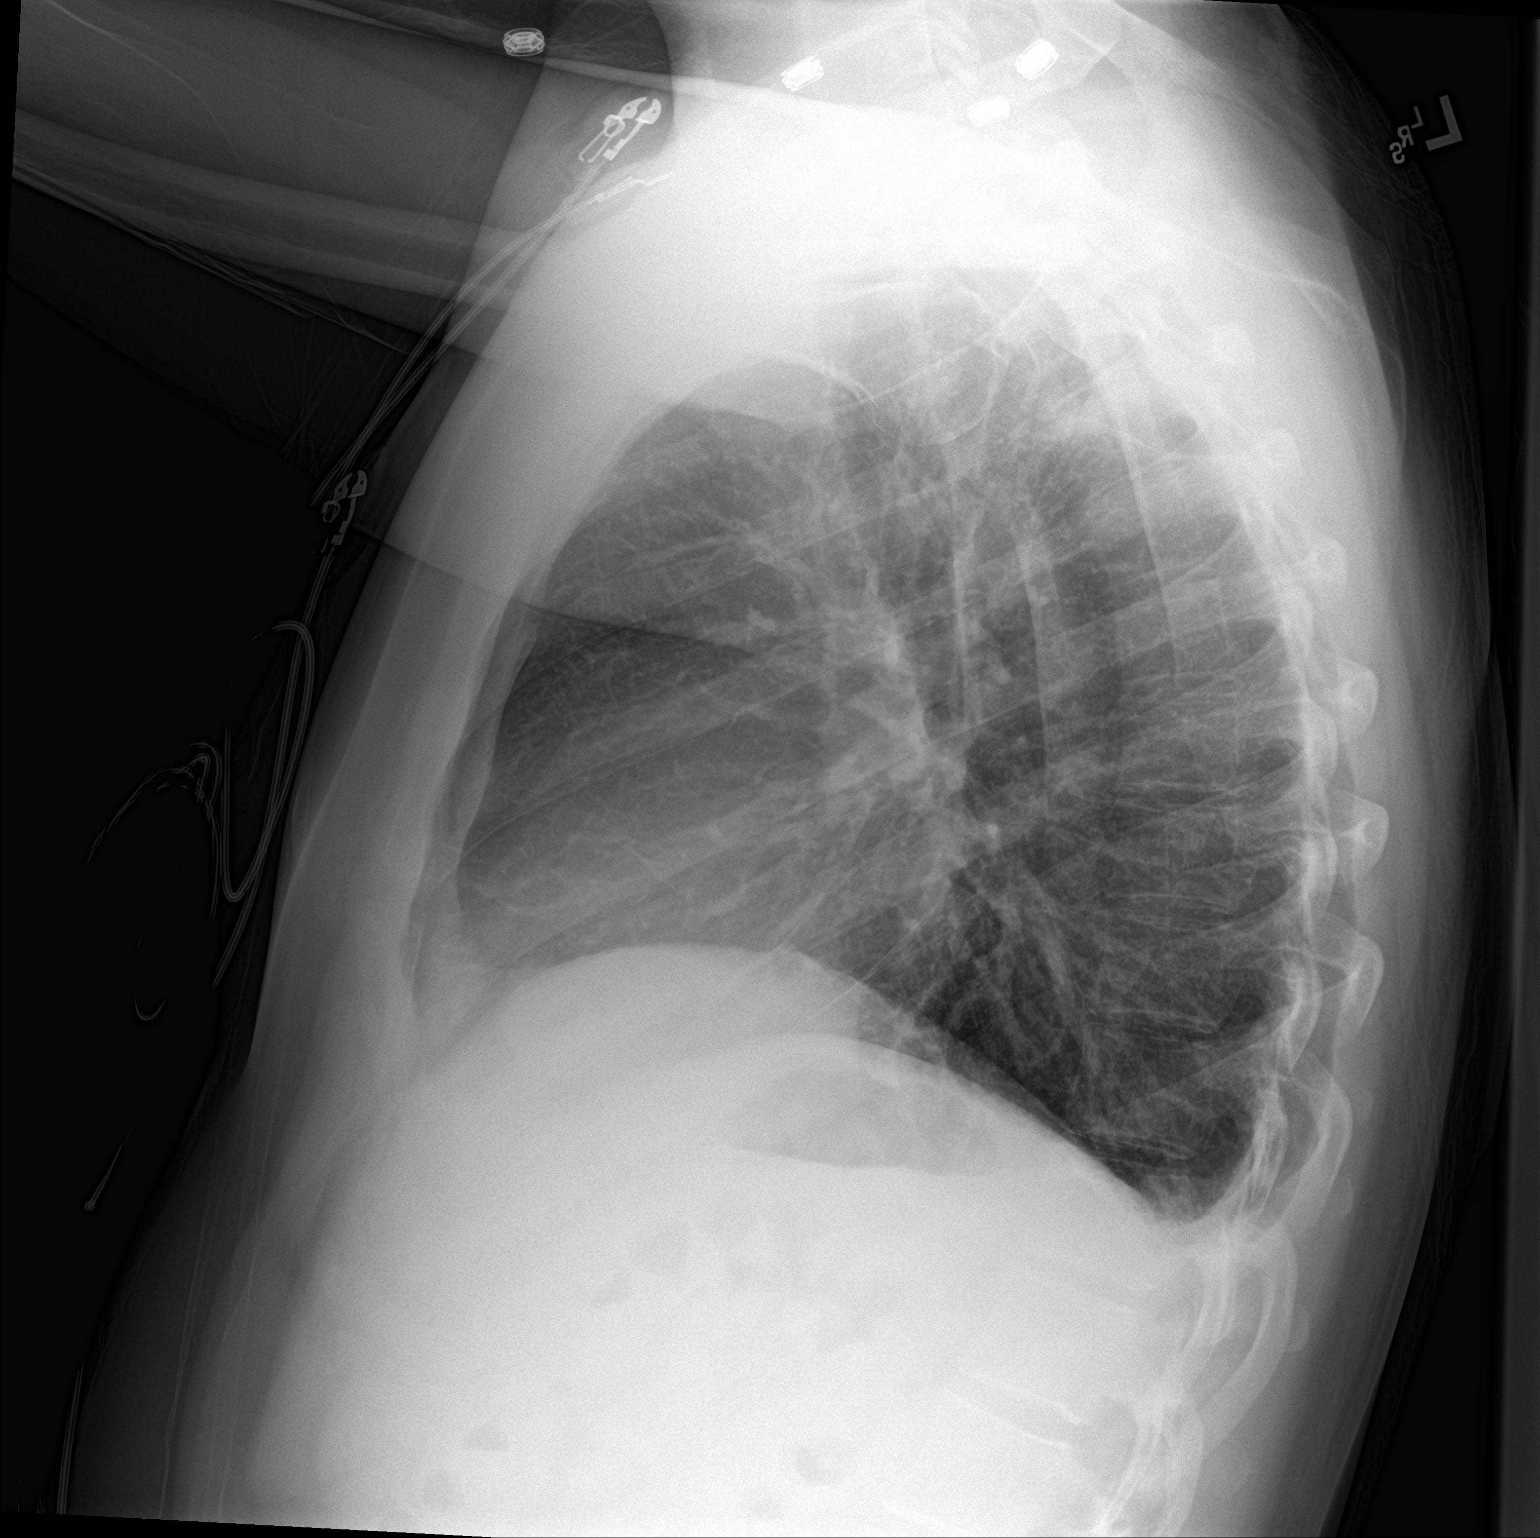

[2 of 2 positions shown; findings below may reference images not displayed]

FINDINGS: Heart and mediastinal contours are within normal limits. No focal
opacities or effusions. No acute bony abnormality.
IMPRESSION: No active cardiopulmonary disease.

## 2020-03-04 IMAGING — CT CT ANGIO CHEST
2 of 8 series · 17 of 46 positions shown · IV contrast (iopamidol)
Comparison: Chest x-ray from same day.

CLINICAL DATA: Chest pain.  History of pericarditis.

EXAM:
CT ANGIOGRAPHY CHEST WITH CONTRAST
TECHNIQUE: Multidetector CT imaging of the chest was performed using the
standard protocol during bolus administration of intravenous
contrast. Multiplanar CT image reconstructions and MIPs were
obtained to evaluate the vascular anatomy.
CONTRAST:  100mL BHMJ2L-7A1 IOPAMIDOL (BHMJ2L-7A1) INJECTION 76%

[Series 6: thins · axial · 0.88mm/px · z∈[+1379,+1596]mm · 14 of 239 slices shown]
[im 11/239  lung]
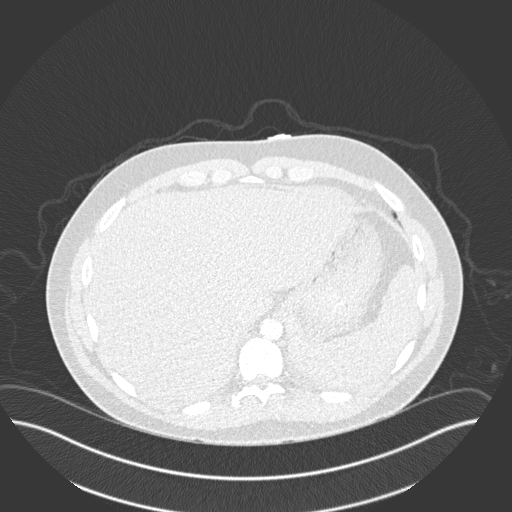
[im 33/239  soft-tissue]
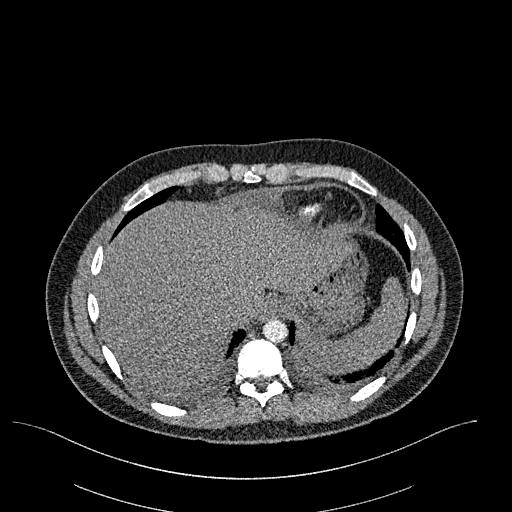
[im 44/239  lung]
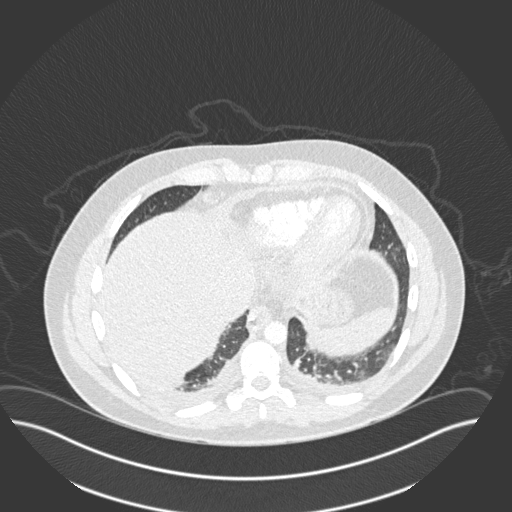
[im 65/239  soft-tissue]
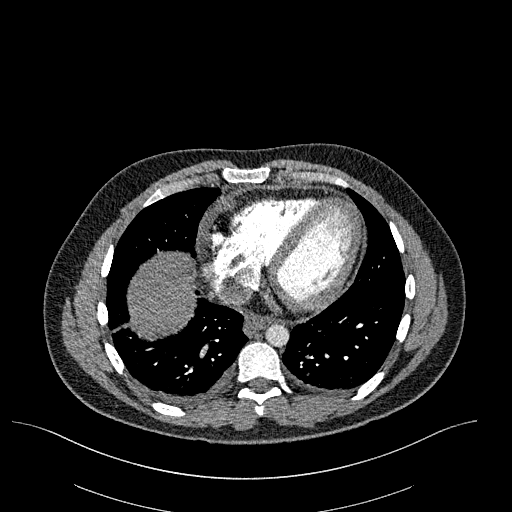
[im 76/239  lung]
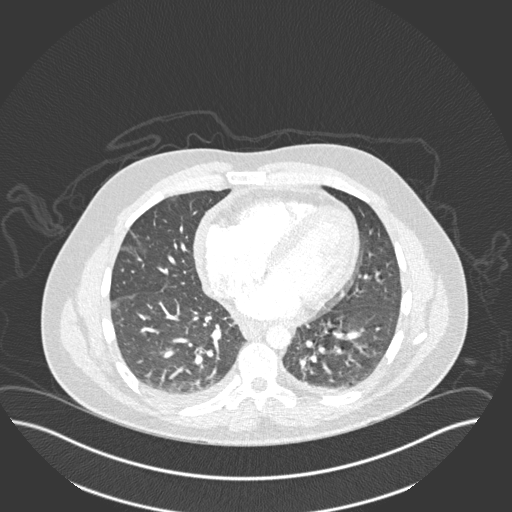
[im 98/239  soft-tissue]
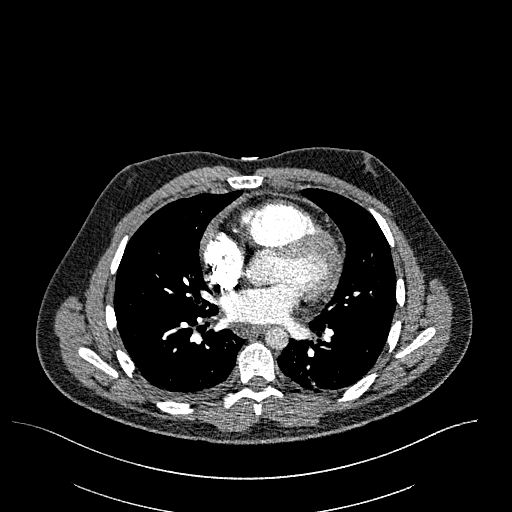
[im 109/239  lung]
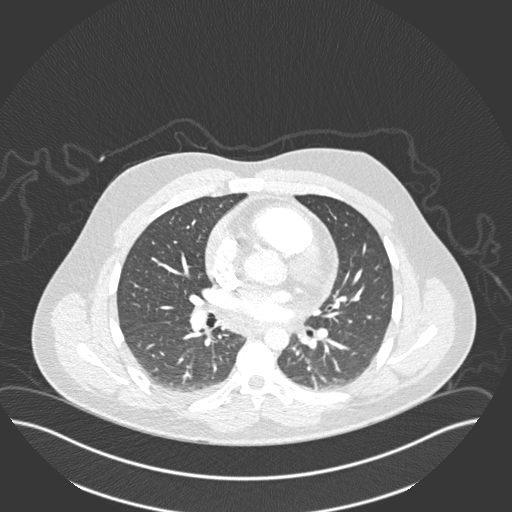
[im 130/239  soft-tissue]
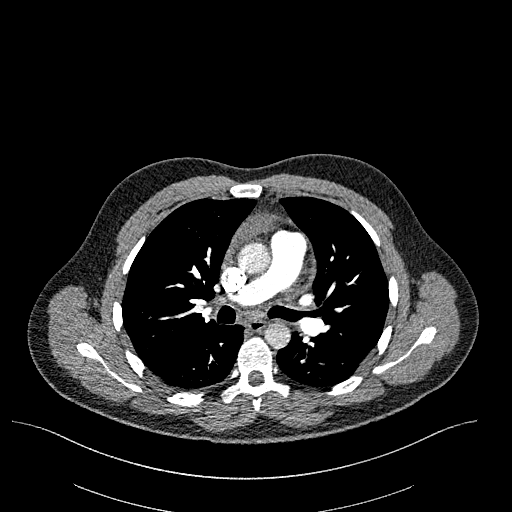
[im 141/239  lung]
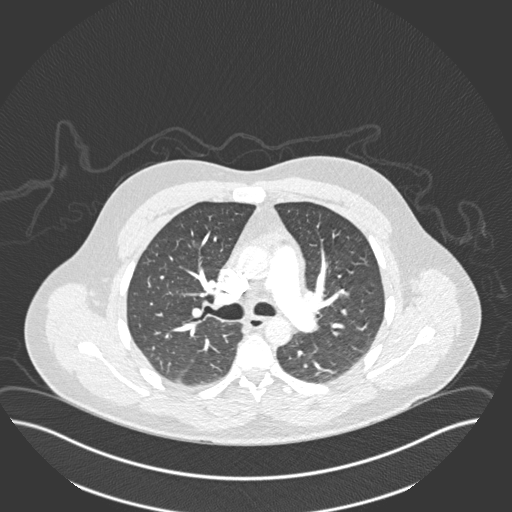
[im 163/239  soft-tissue]
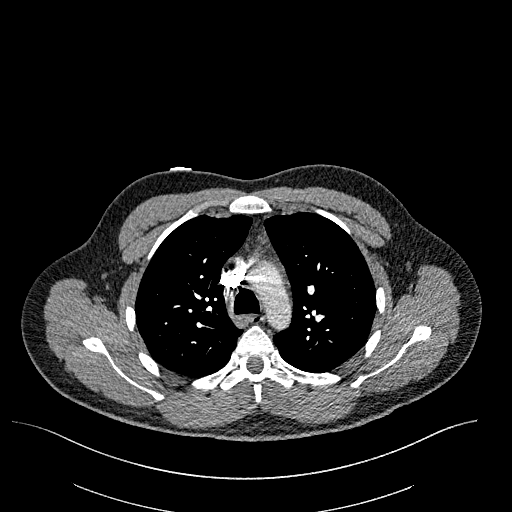
[im 174/239  lung]
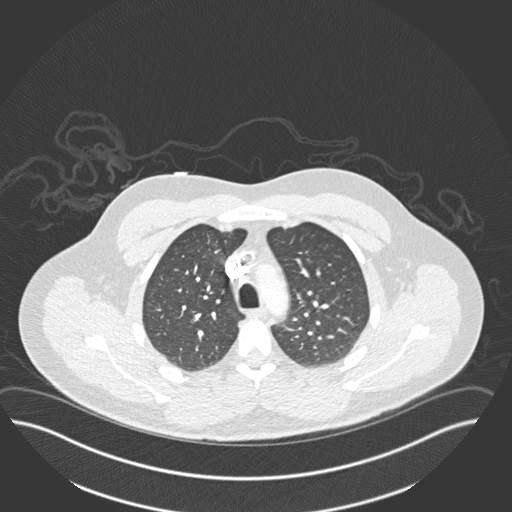
[im 195/239  soft-tissue]
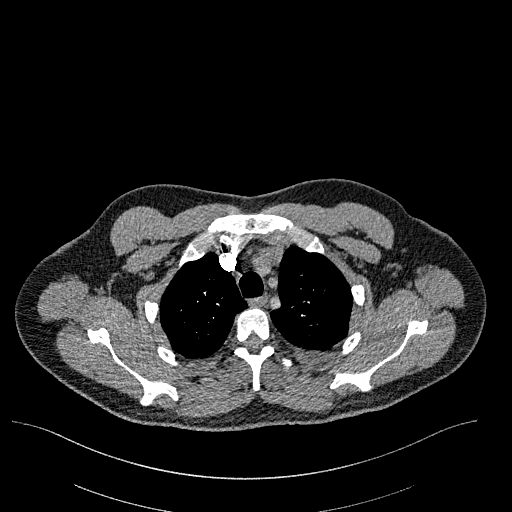
[im 206/239  lung]
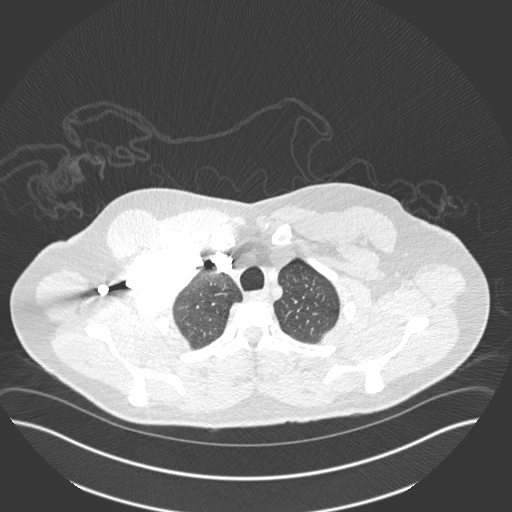
[im 228/239  soft-tissue]
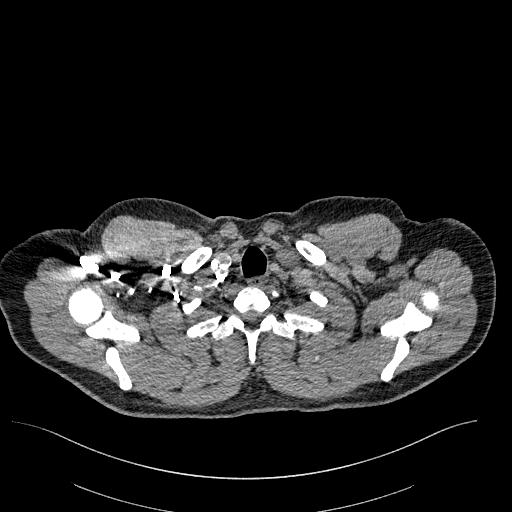

[Series 8: coronal mpr · coronal · 0.46mm/px · 3 of 124 slices shown]
[im 31/124  soft-tissue]
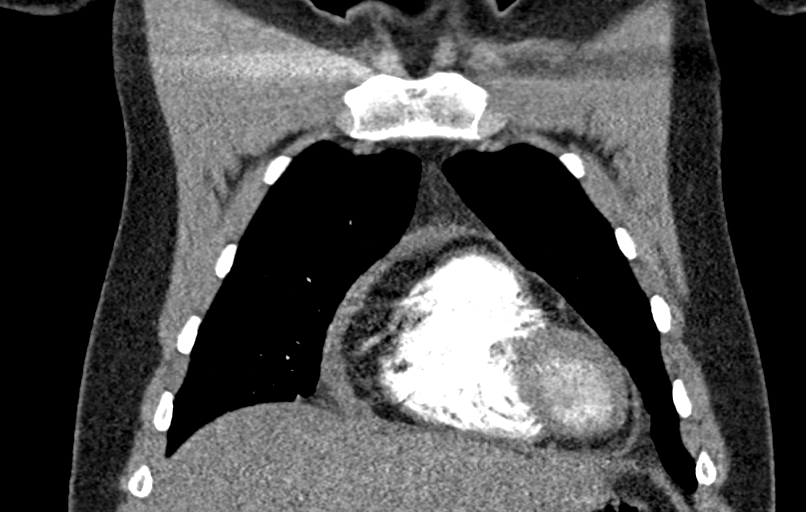
[im 62/124  soft-tissue]
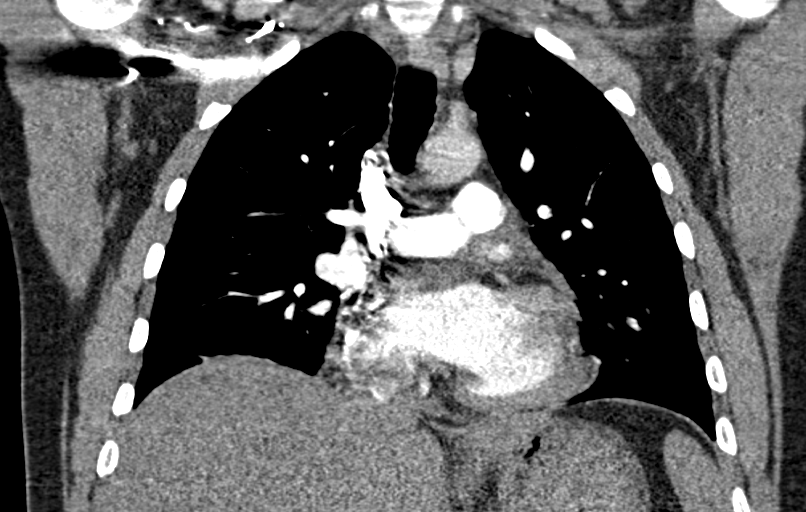
[im 93/124  soft-tissue]
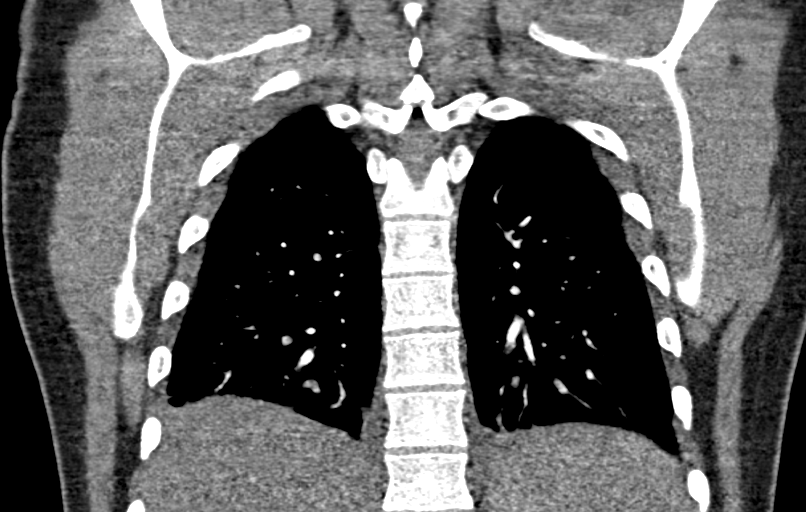

[17 of 46 positions shown; findings below may reference images not displayed]

FINDINGS: Cardiovascular: Satisfactory opacification of the pulmonary arteries
to the segmental level. No evidence of pulmonary embolism. Normal
heart size. Small pericardial effusion. No thoracic aortic aneurysm
or dissection.

Mediastinum/Nodes: No enlarged mediastinal, hilar, or axillary lymph
nodes. Thyroid gland, trachea, and esophagus demonstrate no
significant findings.

Lungs/Pleura: Trace bilateral pleural effusions with mild bilateral
lower lobe subsegmental atelectasis. No consolidation or
pneumothorax. No suspicious pulmonary nodule.

Upper Abdomen: No acute abnormality.

Musculoskeletal: No chest wall abnormality. No acute or significant
osseous findings.

Review of the MIP images confirms the above findings.
IMPRESSION: 1. Small pericardial and trace bilateral pleural effusions. This
could be related to serositis, which can be seen with lupus,
systemic sclerosis, rheumatoid arthritis, or inflammatory bowel
disease.
2.  No evidence of pulmonary embolism.

## 2020-05-12 ENCOUNTER — Telehealth (INDEPENDENT_AMBULATORY_CARE_PROVIDER_SITE_OTHER): Payer: BC Managed Care – PPO | Admitting: Internal Medicine

## 2020-05-12 DIAGNOSIS — R112 Nausea with vomiting, unspecified: Secondary | ICD-10-CM

## 2020-05-12 DIAGNOSIS — R197 Diarrhea, unspecified: Secondary | ICD-10-CM

## 2020-05-12 MED ORDER — ONDANSETRON 4 MG PO TBDP: 4.0000 mg | ORAL_TABLET | Freq: Three times a day (TID) | ORAL | 0 refills | Status: AC | PRN

## 2020-05-12 NOTE — Progress Notes (Signed)
Virtual Visit via Video Note  I connected with Marco Eaton on 05/12/20 at  4:00 PM EDT by a video enabled telemedicine application and verified that I am speaking with the correct person using two identifiers.  Location patient: home Location provider: work office Persons participating in the virtual visit: patient, provider  I discussed the limitations of evaluation and management by telemedicine and the availability of in person appointments. The patient expressed understanding and agreed to proceed.   HPI: Has been having diarrhea for about a week. Feels like it is mostly mucous. Has also been having some n/v for 1 day. No fever, no sick contacts, no recent travel. Has had some cramping lower abdominal pain. He has not yet had his COVID vaccines.   ROS: Constitutional: Denies fever, chills, diaphoresis, appetite change and fatigue.  HEENT: Denies photophobia, eye pain, redness, hearing loss, ear pain, congestion, sore throat, rhinorrhea, sneezing, mouth sores, trouble swallowing, neck pain, neck stiffness and tinnitus.   Respiratory: Denies SOB, DOE, cough, chest tightness,  and wheezing.   Cardiovascular: Denies chest pain, palpitations and leg swelling.  Gastrointestinal: Denies constipation, blood in stool and abdominal distention.  Genitourinary: Denies dysuria, urgency, frequency, hematuria, flank pain and difficulty urinating.  Endocrine: Denies: hot or cold intolerance, sweats, changes in hair or nails, polyuria, polydipsia. Musculoskeletal: Denies myalgias, back pain, joint swelling, arthralgias and gait problem.  Skin: Denies pallor, rash and wound.  Neurological: Denies dizziness, seizures, syncope, weakness, light-headedness, numbness and headaches.  Hematological: Denies adenopathy. Easy bruising, personal or family bleeding history  Psychiatric/Behavioral: Denies suicidal ideation, mood changes, confusion, nervousness, sleep disturbance and agitation   Past  Medical History:  Diagnosis Date  . Asthma   . Kidney stone   . Poor concentration   . Renal disorder     No past surgical history on file.  Family History  Problem Relation Age of Onset  . Drug abuse Neg Hx   . Cancer Neg Hx   . Heart attack Neg Hx     SOCIAL HX:   reports that he quit smoking about 7 years ago. His smoking use included e-cigarettes. He has a 10.00 pack-year smoking history. He has never used smokeless tobacco. He reports current drug use. Drug: Marijuana. He reports that he does not drink alcohol.   Current Outpatient Medications:  .  ALPRAZolam (XANAX) 0.25 MG tablet, Take 1 tablet (0.25 mg total) by mouth once as needed for up to 4 doses for anxiety (Take 1 tablet as needed 30 minutes before airplane ride)., Disp: 4 tablet, Rfl: 0 .  baclofen (LIORESAL) 10 MG tablet, Take 10 mg by mouth 2 (two) times daily as needed for muscle spasms. , Disp: , Rfl:  .  buPROPion (WELLBUTRIN XL) 150 MG 24 hr tablet, TAKE 1 TABLET BY MOUTH EVERY DAY, Disp: 90 tablet, Rfl: 0 .  colchicine 0.6 MG tablet, Take 0.6 mg by mouth 2 (two) times daily. , Disp: , Rfl:  .  escitalopram (LEXAPRO) 10 MG tablet, TAKE 1 TABLET BY MOUTH EVERY DAY, Disp: 90 tablet, Rfl: 0 .  HYDROcodone-acetaminophen (NORCO/VICODIN) 5-325 MG tablet, Take 1 tablet by mouth every 4 (four) hours as needed for moderate pain. , Disp: , Rfl:  .  naproxen sodium (ALEVE) 220 MG tablet, Take 1 tablet (220 mg total) by mouth 2 (two) times daily as needed (for pain)., Disp: 10 tablet, Rfl: 0 .  omeprazole (PRILOSEC) 40 MG capsule, Take 40 mg by mouth daily. , Disp: ,  Rfl:  .  ondansetron (ZOFRAN ODT) 4 MG disintegrating tablet, Take 1 tablet (4 mg total) by mouth every 8 (eight) hours as needed for nausea or vomiting., Disp: 20 tablet, Rfl: 0 .  ondansetron (ZOFRAN) 4 MG tablet, Take 4 mg by mouth every 8 (eight) hours as needed for nausea or vomiting. , Disp: , Rfl:   EXAM:   VITALS per patient if applicable: none  reported  GENERAL: alert, oriented, appears well and in no acute distress  HEENT: atraumatic, conjunttiva clear, no obvious abnormalities on inspection of external nose and ears  NECK: normal movements of the head and neck  LUNGS: on inspection no signs of respiratory distress, breathing rate appears normal, no obvious gross increased work of breathing, gasping or wheezing  CV: no obvious cyanosis  MS: moves all visible extremities without noticeable abnormality  PSYCH/NEURO: pleasant and cooperative, no obvious depression or anxiety, speech and thought processing grossly intact  ASSESSMENT AND PLAN:   Diarrhea of presumed infectious origin Nausea and vomiting, intractability of vomiting not specified, unspecified vomiting type  -Presumed viral gastroenteritis. -Advised rest, fluids. -COVID is a possibility. Advised he get tested. He will notify me of results. -Will take him out of work for the next 4 days, may be longer if his COVID test results positive. -Zofran PRN for nausea has been prescribed. -RTC in 10-14 days if no improvement.    I discussed the assessment and treatment plan with the patient. The patient was provided an opportunity to ask questions and all were answered. The patient agreed with the plan and demonstrated an understanding of the instructions.   The patient was advised to call back or seek an in-person evaluation if the symptoms worsen or if the condition fails to improve as anticipated.    Chaya Jan, MD  [facility] at [facility]

## 2020-06-04 ENCOUNTER — Other Ambulatory Visit: Payer: Self-pay | Admitting: Internal Medicine

## 2020-06-16 ENCOUNTER — Encounter: Payer: Self-pay | Admitting: Internal Medicine

## 2020-06-17 ENCOUNTER — Other Ambulatory Visit: Payer: Self-pay | Admitting: Internal Medicine

## 2020-06-17 DIAGNOSIS — F40243 Fear of flying: Secondary | ICD-10-CM

## 2020-06-17 MED ORDER — ALPRAZOLAM 0.25 MG PO TABS: 0.2500 mg | ORAL_TABLET | Freq: Once | ORAL | 0 refills | Status: AC | PRN

## 2020-06-28 ENCOUNTER — Other Ambulatory Visit: Payer: Self-pay | Admitting: Internal Medicine

## 2020-06-28 DIAGNOSIS — F339 Major depressive disorder, recurrent, unspecified: Secondary | ICD-10-CM

## 2020-07-01 ENCOUNTER — Other Ambulatory Visit: Payer: Self-pay | Admitting: Internal Medicine

## 2020-07-01 DIAGNOSIS — F339 Major depressive disorder, recurrent, unspecified: Secondary | ICD-10-CM

## 2020-09-29 ENCOUNTER — Other Ambulatory Visit: Payer: Self-pay | Admitting: Internal Medicine

## 2020-09-29 DIAGNOSIS — F339 Major depressive disorder, recurrent, unspecified: Secondary | ICD-10-CM

## 2020-10-08 ENCOUNTER — Other Ambulatory Visit: Payer: Self-pay | Admitting: Internal Medicine

## 2020-10-08 DIAGNOSIS — F339 Major depressive disorder, recurrent, unspecified: Secondary | ICD-10-CM

## 2020-10-09 ENCOUNTER — Other Ambulatory Visit: Payer: Self-pay | Admitting: Internal Medicine

## 2020-12-21 ENCOUNTER — Encounter: Payer: Self-pay | Admitting: Internal Medicine

## 2022-06-15 ENCOUNTER — Emergency Department (HOSPITAL_COMMUNITY): Payer: 59

## 2022-06-15 ENCOUNTER — Encounter (HOSPITAL_COMMUNITY): Payer: Self-pay

## 2022-06-15 ENCOUNTER — Other Ambulatory Visit: Payer: Self-pay

## 2022-06-15 ENCOUNTER — Emergency Department (HOSPITAL_COMMUNITY)
Admission: EM | Admit: 2022-06-15 | Discharge: 2022-06-15 | Disposition: A | Discharge status: Home / Self Care | Payer: 59 | Attending: Emergency Medicine | Admitting: Emergency Medicine

## 2022-06-15 DIAGNOSIS — N390 Urinary tract infection, site not specified: Secondary | ICD-10-CM | POA: Diagnosis not present

## 2022-06-15 DIAGNOSIS — R1030 Lower abdominal pain, unspecified: Secondary | ICD-10-CM | POA: Insufficient documentation

## 2022-06-15 DIAGNOSIS — R300 Dysuria: Secondary | ICD-10-CM | POA: Diagnosis not present

## 2022-06-15 DIAGNOSIS — D72829 Elevated white blood cell count, unspecified: Secondary | ICD-10-CM | POA: Insufficient documentation

## 2022-06-15 DIAGNOSIS — R319 Hematuria, unspecified: Secondary | ICD-10-CM | POA: Diagnosis not present

## 2022-06-15 LAB — CBC WITH DIFFERENTIAL/PLATELET
Abs Immature Granulocytes: 0.05 10*3/uL (ref 0.00–0.07)
Basophils Absolute: 0.1 10*3/uL (ref 0.0–0.1)
Basophils Relative: 1 %
Eosinophils Absolute: 0.3 10*3/uL (ref 0.0–0.5)
Eosinophils Relative: 2 %
HCT: 44.3 % (ref 39.0–52.0)
Hemoglobin: 15.1 g/dL (ref 13.0–17.0)
Immature Granulocytes: 0 %
Lymphocytes Relative: 30 %
Lymphs Abs: 3.8 10*3/uL (ref 0.7–4.0)
MCH: 29.8 pg (ref 26.0–34.0)
MCHC: 34.1 g/dL (ref 30.0–36.0)
MCV: 87.4 fL (ref 80.0–100.0)
Monocytes Absolute: 0.9 10*3/uL (ref 0.1–1.0)
Monocytes Relative: 7 %
Neutro Abs: 7.5 10*3/uL (ref 1.7–7.7)
Neutrophils Relative %: 60 %
Platelets: 296 10*3/uL (ref 150–400)
RBC: 5.07 MIL/uL (ref 4.22–5.81)
RDW: 12 % (ref 11.5–15.5)
WBC: 12.5 10*3/uL — ABNORMAL HIGH (ref 4.0–10.5)
nRBC: 0 % (ref 0.0–0.2)

## 2022-06-15 LAB — COMPREHENSIVE METABOLIC PANEL
ALT: 32 U/L (ref 0–44)
AST: 19 U/L (ref 15–41)
Albumin: 4.2 g/dL (ref 3.5–5.0)
Alkaline Phosphatase: 54 U/L (ref 38–126)
Anion gap: 9 (ref 5–15)
BUN: 10 mg/dL (ref 6–20)
CO2: 23 mmol/L (ref 22–32)
Calcium: 9.6 mg/dL (ref 8.9–10.3)
Chloride: 106 mmol/L (ref 98–111)
Creatinine, Ser: 1.21 mg/dL (ref 0.61–1.24)
GFR, Estimated: >60 mL/min (ref >60)
Glucose, Bld: 89 mg/dL (ref 70–99)
Potassium: 3.8 mmol/L (ref 3.5–5.1)
Sodium: 138 mmol/L (ref 135–145)
Total Bilirubin: 1 mg/dL (ref 0.3–1.2)
Total Protein: 8 g/dL (ref 6.5–8.1)

## 2022-06-15 LAB — URINALYSIS, ROUTINE W REFLEX MICROSCOPIC
Bilirubin Urine: NEGATIVE
Glucose, UA: NEGATIVE mg/dL
Ketones, ur: NEGATIVE mg/dL
Nitrite: NEGATIVE
Protein, ur: 100 mg/dL — AB
RBC / HPF: >50 RBC/hpf — ABNORMAL HIGH (ref 0–5)
Specific Gravity, Urine: 1.012 (ref 1.005–1.030)
WBC, UA: >50 WBC/hpf — ABNORMAL HIGH (ref 0–5)
pH: 5 (ref 5.0–8.0)

## 2022-06-15 MED ORDER — KETOROLAC TROMETHAMINE 15 MG/ML IJ SOLN
15.0000 mg | Freq: Once | INTRAMUSCULAR | Status: AC
  Administered 2022-06-15: 15 mg via INTRAVENOUS
  Filled 2022-06-15: qty 1

## 2022-06-15 MED ORDER — SODIUM CHLORIDE 0.9 % IV SOLN
2.0000 g | Freq: Once | INTRAVENOUS | Status: AC
  Administered 2022-06-15: 2 g via INTRAVENOUS
  Filled 2022-06-15: qty 20

## 2022-06-15 MED ORDER — CEPHALEXIN 500 MG PO CAPS: 500.0000 mg | ORAL_CAPSULE | Freq: Four times a day (QID) | ORAL | 0 refills | Status: AC

## 2022-06-15 NOTE — ED Provider Notes (Signed)
[facility]-EMERGENCY DEPT Provider Note   CSN: [id number] Arrival date & time: 06/15/22  1246     History  Chief Complaint  Patient presents with   Back Pain   Dysuria   Hematuria    Marco Eaton is a 31 y.o. male.  PMH significant for Kidney stones (4 X lifetime, most recently 2015, no h/o surgical intervention) now presenting to the ED for persistent dysuria X 1.5 weeks a/w Bilateral non-radiating lower back pain (R>L) X yesterday. Pain is constant in nature, mildly progressive, currently a 4/10. Patient passed a clot while straining to urinate three days ago, o/w denies hematuria. He reports mild nausea this morning which has since resolved, no vomiting diarrhea or fevers. Currently he feels a mild pressure in the suprapubic region of his abdomen. He took Ibuprofen this morning approximately 6.5 hours ago, which provided minimal relief. No CP or SOB.  HPI     Home Medications Prior to Admission medications   Medication Sig Start Date End Date Taking? Authorizing Provider  cephALEXin (KEFLEX) 500 MG capsule Take 1 capsule (500 mg total) by mouth 4 (four) times daily for 7 days. 06/15/22 06/22/22 Yes Terran Hollenkamp, Quitman Livings, MD  ALPRAZolam Prudy Feeler) 0.25 MG tablet Take 1 tablet (0.25 mg total) by mouth once as needed for up to 4 doses for anxiety (Take 1 tablet as needed 30 minutes before airplane ride). 06/17/20   Philip Aspen, Limmie Patricia, MD  baclofen (LIORESAL) 10 MG tablet Take 10 mg by mouth 2 (two) times daily as needed for muscle spasms.  09/20/18   [provider]  buPROPion (WELLBUTRIN XL) 150 MG 24 hr tablet TAKE 1 TABLET BY MOUTH EVERY DAY 10/11/20   Philip Aspen, Limmie Patricia, MD  colchicine 0.6 MG tablet Take 0.6 mg by mouth 2 (two) times daily.  10/02/18   [provider]  escitalopram (LEXAPRO) 10 MG tablet TAKE 1 TABLET BY MOUTH EVERY DAY 09/29/20   Philip Aspen, Limmie Patricia, MD  HYDROcodone-acetaminophen (NORCO/VICODIN) 5-325 MG  tablet Take 1 tablet by mouth every 4 (four) hours as needed for moderate pain.     [provider]  naproxen sodium (ALEVE) 220 MG tablet Take 1 tablet (220 mg total) by mouth 2 (two) times daily as needed (for pain). 11/25/18   Elpidio Anis, PA-C  omeprazole (PRILOSEC) 40 MG capsule TAKE 1 CAPSULE ONCE A DAY PRIOR TO BREAKFAST 10/11/20   Philip Aspen, Limmie Patricia, MD  ondansetron (ZOFRAN ODT) 4 MG disintegrating tablet Take 1 tablet (4 mg total) by mouth every 8 (eight) hours as needed for nausea or vomiting. 05/12/20   Philip Aspen, Limmie Patricia, MD  ondansetron (ZOFRAN) 4 MG tablet Take 4 mg by mouth every 8 (eight) hours as needed for nausea or vomiting.     [provider]      Allergies    Cat hair extract and Other    Review of Systems   Review of Systems  Constitutional:  Negative for chills and fever.  HENT:  Negative for ear pain and sore throat.   Eyes:  Negative for pain and visual disturbance.  Respiratory:  Negative for cough and shortness of breath.   Cardiovascular:  Negative for chest pain and palpitations.  Gastrointestinal:  Positive for abdominal pain. Negative for nausea and vomiting.  Genitourinary:  Positive for dysuria. Negative for hematuria and testicular pain.  Musculoskeletal:  Positive for back pain. Negative for arthralgias.  Skin:  Negative for color change and rash.  Neurological:  Negative for seizures and syncope.  All other systems reviewed and are negative.   Physical Exam Updated Vital Signs BP 133/79   Pulse 70   Temp 98.2 F (36.8 C) (Oral)   Resp 19   Ht 5\' 10"  (1.778 m)   Wt 93 kg   SpO2 97%   BMI 29.41 kg/m  Physical Exam Vitals and nursing note reviewed.  Constitutional:      General: He is not in acute distress.    Appearance: He is well-developed.  HENT:     Head: Normocephalic and atraumatic.  Eyes:     Conjunctiva/sclera: Conjunctivae normal.  Cardiovascular:     Rate and Rhythm: Normal rate and regular  rhythm.     Heart sounds: No murmur heard. Pulmonary:     Effort: Pulmonary effort is normal. No respiratory distress.     Breath sounds: Normal breath sounds.  Abdominal:     Palpations: Abdomen is soft.     Tenderness: There is no abdominal tenderness.  Genitourinary:    Comments: Scrotum is normal, no tenderness to palpation in either testicle, normal lie, no redness or swelling; Abby RN chaperone present Musculoskeletal:        General: No swelling.     Cervical back: Neck supple.     Comments: There is some tenderness to the right flank, right CVA  Skin:    General: Skin is warm and dry.     Capillary Refill: Capillary refill takes less than 2 seconds.  Neurological:     Mental Status: He is alert.  Psychiatric:        Mood and Affect: Mood normal.     ED Results / Procedures / Treatments   Labs (all labs ordered are listed, but only abnormal results are displayed) Labs Reviewed  CBC WITH DIFFERENTIAL/PLATELET - Abnormal; Notable for the following components:      Result Value   WBC 12.5 (*)    All other components within normal limits  URINALYSIS, ROUTINE W REFLEX MICROSCOPIC - Abnormal; Notable for the following components:   APPearance CLOUDY (*)    Hgb urine dipstick MODERATE (*)    Protein, ur 100 (*)    Leukocytes,Ua LARGE (*)    RBC / HPF >50 (*)    WBC, UA >50 (*)    Bacteria, UA RARE (*)    Non Squamous Epithelial 0-5 (*)    All other components within normal limits  COMPREHENSIVE METABOLIC PANEL    EKG None  Radiology CT Renal Stone Study  Result Date: 06/15/2022 CLINICAL DATA:  Dysuria and hematuria. EXAM: CT ABDOMEN AND PELVIS WITHOUT CONTRAST TECHNIQUE: Multidetector CT imaging of the abdomen and pelvis was performed following the standard protocol without IV contrast. RADIATION DOSE REDUCTION: This exam was performed according to the departmental dose-optimization program which includes automated exposure control, adjustment of the mA and/or kV  according to patient size and/or use of iterative reconstruction technique. COMPARISON:  CT January 25, 2016 FINDINGS: Lower chest: No acute abnormality. Hepatobiliary: Marked diffuse hepatic steatosis. Nodular focal fatty sparing along the gallbladder fossa. Gallbladder is unremarkable. No biliary ductal dilation. Pancreas: No pancreatic ductal dilation or evidence of acute inflammation. Spleen: No splenomegaly. Adrenals/Urinary Tract: Bilateral adrenal glands are within normal limits. No hydronephrosis. Punctate nonobstructive bilateral renal stones measure up to 3 mm on the right and 1-2 mm on the left. Mild right-sided periureteric stranding. Urinary bladder is unremarkable for degree of distension. Stomach/Bowel: No radiopaque enteric contrast material was administered.  Stomach is distended with ingested material without focal wall thickening. No pathologic dilation of small or large bowel. The appendix and terminal ileum are within normal limits. Moderate volume of formed stool throughout the colon suggestive of constipation. Vascular/Lymphatic: Normal caliber abdominal aorta. No pathologically enlarged abdominal or pelvic lymph nodes. Reproductive: Prostate is unremarkable. Other: No significant abdominopelvic free fluid. Musculoskeletal: No acute or significant osseous findings. IMPRESSION: 1. Punctate nonobstructive bilateral renal stones. 2. Mild right-sided periureteric stranding which may reflect recently passed renal stone or ascending urinary tract infection recommend correlation with laboratory values. 3. Marked diffuse hepatic steatosis with nodular fatty sparing along the gallbladder fossa. 4. Moderate volume of formed stool throughout the colon suggestive of constipation. Electronically Signed   By: Maudry Mayhew M.D.   On: 06/15/2022 15:22    Procedures Procedures    Medications Ordered in ED Medications  cefTRIAXone (ROCEPHIN) 2 g in sodium chloride 0.9 % 100 mL IVPB (0 g Intravenous  Stopped 06/15/22 1827)  ketorolac (TORADOL) 15 MG/ML injection 15 mg (15 mg Intravenous Given 06/15/22 1732)    ED Course/ Medical Decision Making/ A&P                           Medical Decision Making Risk Prescription drug management.   31 year old male presents for right flank pain and dysuria.  On exam well-appearing, no distress, afebrile.  CT scan independently reviewed and interpreted by myself.  No obvious ureterolithiasis or hydronephrosis.  Radiologist commented on mild right-sided periureteric stranding which could reflect recently passed stone or a simple UTI.  Based on patient's history, suspect a sending UTI.  Likely cystitis that is progressing towards pyelonephritis.  Reviewed labs, mild leukocytosis, no significant AKI or electrolyte derangement.  Will give dose of IV antibiotics, pain medicine and reassess.  Reassessed, pain well controlled, well-appearing, continues to be afebrile.  No ongoing vomiting.  Feel he is appropriate for trial of outpatient management at this time.  Recommended recheck with primary doctor, reviewed return precautions and discharge.  After the discussed management above, the patient was determined to be safe for discharge.  The patient was in agreement with this plan and all questions regarding their care were answered.  ED return precautions were discussed and the patient will return to the ED with any significant worsening of condition.         Final Clinical Impression(s) / ED Diagnoses Final diagnoses:  Urinary tract infection with hematuria, site unspecified    Rx / DC Orders ED Discharge Orders          Ordered    cephALEXin (KEFLEX) 500 MG capsule  4 times daily        06/15/22 1917              Milagros Loll, MD 06/15/22 1918

## 2022-06-15 NOTE — Discharge Instructions (Addendum)
Take antibiotic as prescribed.  For pain take Tylenol or Motrin as needed.  If you develop fever, worsening pain, vomiting or other new concerning symptom, come back to ER for reassessment.  I would recommend following up with your primary care doctor, call their appointment for recheck tomorrow or on Monday.

## 2022-06-15 NOTE — ED Triage Notes (Signed)
Patient reports he has been having dysuria x 1.5 weeks ago. Patient states he had blood clots when urinating 3 days ago. Patient reports a history of kidney stones.  Patient now c/o bilateral lower back pain R>L and continues to have dysuria.

## 2022-06-15 NOTE — ED Provider Triage Note (Signed)
Emergency Medicine Provider Triage Evaluation Note  Marco Eaton , a 31 y.o. male  was evaluated in triage.  Pt complains of dysuria, passing clots of blood. Hasn't passed blood since yesterday, has been drinking lots of water. Ongoing low back pain, concern for kidney stone vs kidney infection.   Review of Systems  Positive: Dysuria, hematuria  Negative: fever  Physical Exam  BP (!) 131/92 (BP Location: Left Arm)   Pulse 86   Temp 98.2 F (36.8 C) (Oral)   Resp 14   Ht 5\' 10"  (1.778 m)   Wt 93 kg   SpO2 96%   BMI 29.41 kg/m  Gen:   Awake, no distress   Resp:  Normal effort  MSK:   Moves extremities without difficulty  Other:  No CVA tenderness, no pain with palpation low back  Medical Decision Making  Medically screening exam initiated at 1:01 PM.  Appropriate orders placed.  was informed that the remainder of the evaluation will be completed by another provider, this initial triage assessment does not replace that evaluation, and the importance of remaining in the ED until their evaluation is complete.     [name], PA-C 06/15/22 1302

## 2022-07-08 ENCOUNTER — Encounter (HOSPITAL_COMMUNITY): Payer: Self-pay

## 2022-07-08 ENCOUNTER — Emergency Department (HOSPITAL_COMMUNITY)
Admission: EM | Admit: 2022-07-08 | Discharge: 2022-07-08 | Disposition: A | Discharge status: Home / Self Care | Payer: 59 | Attending: Emergency Medicine | Admitting: Emergency Medicine

## 2022-07-08 ENCOUNTER — Other Ambulatory Visit: Payer: Self-pay

## 2022-07-08 ENCOUNTER — Emergency Department (HOSPITAL_COMMUNITY): Payer: 59

## 2022-07-08 DIAGNOSIS — N390 Urinary tract infection, site not specified: Secondary | ICD-10-CM | POA: Diagnosis not present

## 2022-07-08 DIAGNOSIS — R1030 Lower abdominal pain, unspecified: Secondary | ICD-10-CM | POA: Diagnosis not present

## 2022-07-08 DIAGNOSIS — N200 Calculus of kidney: Secondary | ICD-10-CM | POA: Diagnosis not present

## 2022-07-08 DIAGNOSIS — K449 Diaphragmatic hernia without obstruction or gangrene: Secondary | ICD-10-CM | POA: Diagnosis not present

## 2022-07-08 DIAGNOSIS — R161 Splenomegaly, not elsewhere classified: Secondary | ICD-10-CM | POA: Diagnosis not present

## 2022-07-08 DIAGNOSIS — K760 Fatty (change of) liver, not elsewhere classified: Secondary | ICD-10-CM | POA: Diagnosis not present

## 2022-07-08 LAB — URINALYSIS, ROUTINE W REFLEX MICROSCOPIC
Bilirubin Urine: NEGATIVE
Glucose, UA: NEGATIVE mg/dL
Hgb urine dipstick: NEGATIVE
Ketones, ur: NEGATIVE mg/dL
Nitrite: NEGATIVE
Protein, ur: 30 mg/dL — AB
Specific Gravity, Urine: 1.014 (ref 1.005–1.030)
WBC, UA: >50 WBC/hpf — ABNORMAL HIGH (ref 0–5)
pH: 5 (ref 5.0–8.0)

## 2022-07-08 LAB — BASIC METABOLIC PANEL
Anion gap: 7 (ref 5–15)
BUN: 11 mg/dL (ref 6–20)
CO2: 23 mmol/L (ref 22–32)
Calcium: 9.1 mg/dL (ref 8.9–10.3)
Chloride: 107 mmol/L (ref 98–111)
Creatinine, Ser: 1.34 mg/dL — ABNORMAL HIGH (ref 0.61–1.24)
GFR, Estimated: >60 mL/min (ref >60)
Glucose, Bld: 166 mg/dL — ABNORMAL HIGH (ref 70–99)
Potassium: 4 mmol/L (ref 3.5–5.1)
Sodium: 137 mmol/L (ref 135–145)

## 2022-07-08 LAB — CBC
HCT: 42.9 % (ref 39.0–52.0)
Hemoglobin: 14.8 g/dL (ref 13.0–17.0)
MCH: 30 pg (ref 26.0–34.0)
MCHC: 34.5 g/dL (ref 30.0–36.0)
MCV: 86.8 fL (ref 80.0–100.0)
Platelets: 279 10*3/uL (ref 150–400)
RBC: 4.94 MIL/uL (ref 4.22–5.81)
RDW: 11.9 % (ref 11.5–15.5)
WBC: 11.3 10*3/uL — ABNORMAL HIGH (ref 4.0–10.5)
nRBC: 0 % (ref 0.0–0.2)

## 2022-07-08 MED ORDER — SODIUM CHLORIDE 0.9 % IV SOLN
1.0000 g | Freq: Once | INTRAVENOUS | Status: AC
  Administered 2022-07-08: 1 g via INTRAVENOUS
  Filled 2022-07-08: qty 10

## 2022-07-08 MED ORDER — KETOROLAC TROMETHAMINE 15 MG/ML IJ SOLN
15.0000 mg | Freq: Once | INTRAMUSCULAR | Status: AC
  Administered 2022-07-08: 15 mg via INTRAVENOUS
  Filled 2022-07-08: qty 1

## 2022-07-08 MED ORDER — SULFAMETHOXAZOLE-TRIMETHOPRIM 800-160 MG PO TABS: 1.0000 | ORAL_TABLET | Freq: Two times a day (BID) | ORAL | 0 refills | Status: AC

## 2022-07-08 MED ORDER — SODIUM CHLORIDE 0.9 % IV BOLUS
1000.0000 mL | Freq: Once | INTRAVENOUS | Status: AC
Start: 2022-07-08 — End: 2022-07-08
  Administered 2022-07-08: 1000 mL via INTRAVENOUS

## 2022-07-08 MED ORDER — SODIUM CHLORIDE (PF) 0.9 % IJ SOLN
INTRAMUSCULAR | Status: AC
  Filled 2022-07-08: qty 50

## 2022-07-08 MED ORDER — IOHEXOL 300 MG/ML  SOLN
100.0000 mL | Freq: Once | INTRAMUSCULAR | Status: AC | PRN
  Administered 2022-07-08: 100 mL via INTRAVENOUS

## 2022-07-08 NOTE — Discharge Instructions (Addendum)
Follow-up with urology given the persistent urinary tract infection.  If you develop fever, new or worsening urinary symptoms, flank or abdominal pain, vomiting, or any other new/concerning symptoms then return to the ER for evaluation.

## 2022-07-08 NOTE — ED Triage Notes (Signed)
Pt reports right flank pain and dysuria that has become worse since finishing antibiotics from last hospital visit.

## 2022-07-08 NOTE — ED Provider Notes (Signed)
[facility]-EMERGENCY DEPT Provider Note   CSN: [id number] Arrival date & time: 07/08/22  1553     History  Chief Complaint  Patient presents with   Flank Pain    Marco Eaton is a 31 y.o. male.  HPI 31 year old male with a history of kidney stones presents with continued dysuria.  He was here on 7/6 and diagnosed with a urinary tract infection and put on antibiotics.  Prior to this he had had symptoms for a couple weeks.  Symptoms never fully resolved though did seem like he got better but now his pain is back and is back.  He is continuing to have dysuria and occasional hematuria.  Some lower abdominal tenderness as well.  No fevers during this time.  Pain is mild to moderate.  Of note, he felt like he passed a kidney stone around the time he was here last time and has not felt like he has a recurrent.  Home Medications Prior to Admission medications   Medication Sig Start Date End Date Taking? Authorizing Provider  ALPRAZolam (XANAX) 0.25 MG tablet Take 1 tablet (0.25 mg total) by mouth once as needed for up to 4 doses for anxiety (Take 1 tablet as needed 30 minutes before airplane ride). Patient taking differently: Take 0.25 mg by mouth once as needed (up to 4 doses, as needed for anxiety- 30 minutes before an airplane flight). 06/17/20  Yes Philip Aspen, Limmie Patricia, MD  baclofen (LIORESAL) 10 MG tablet Take 10 mg by mouth 2 (two) times daily as needed (for hiccups). 09/20/18  Yes [provider]  Buprenorphine HCl-Naloxone HCl 8-2 MG FILM Place 1 Film under the tongue 2 (two) times daily. 07/03/22  Yes [provider]  buPROPion (WELLBUTRIN SR) 200 MG 12 hr tablet Take 200 mg by mouth 2 (two) times daily. 07/02/22  Yes [provider]  escitalopram (LEXAPRO) 10 MG tablet TAKE 1 TABLET BY MOUTH EVERY DAY Patient taking differently: Take 10 mg by mouth in the morning. 09/29/20  Yes Philip Aspen, Limmie Patricia, MD  naproxen sodium  (ALEVE) 220 MG tablet Take 1 tablet (220 mg total) by mouth 2 (two) times daily as needed (for pain). 11/25/18  Yes Upstill, Shari, PA-C  ondansetron (ZOFRAN) 4 MG tablet Take 4 mg by mouth every 8 (eight) hours as needed for nausea or vomiting.    Yes [provider]  PRILOSEC OTC 20 MG tablet Take 20 mg by mouth daily before breakfast.   Yes [provider]  sulfamethoxazole-trimethoprim (BACTRIM DS) 800-160 MG tablet Take 1 tablet by mouth 2 (two) times daily for 10 days. 07/08/22 07/18/22 Yes Pricilla Loveless, MD  buPROPion (WELLBUTRIN XL) 150 MG 24 hr tablet TAKE 1 TABLET BY MOUTH EVERY DAY Patient not taking: Reported on 07/08/2022 10/11/20   Philip Aspen, Limmie Patricia, MD  omeprazole (PRILOSEC) 40 MG capsule TAKE 1 CAPSULE ONCE A DAY PRIOR TO BREAKFAST Patient not taking: Reported on 07/08/2022 10/11/20   Philip Aspen, Limmie Patricia, MD  ondansetron (ZOFRAN ODT) 4 MG disintegrating tablet Take 1 tablet (4 mg total) by mouth every 8 (eight) hours as needed for nausea or vomiting. Patient not taking: Reported on 07/08/2022 05/12/20   Philip Aspen, Limmie Patricia, MD      Allergies    Cat hair extract    Review of Systems   Review of Systems  Constitutional:  Negative for fever.  Gastrointestinal:  Positive for abdominal pain.  Genitourinary:  Positive for dysuria and  hematuria.  Musculoskeletal:  Positive for back pain.    Physical Exam Updated Vital Signs BP 117/75   Pulse 72   Temp 98.3 F (36.8 C) (Oral)   Resp 18   SpO2 100%  Physical Exam Vitals and nursing note reviewed.  Constitutional:      Appearance: He is well-developed.  HENT:     Head: Normocephalic and atraumatic.  Cardiovascular:     Rate and Rhythm: Normal rate and regular rhythm.     Heart sounds: Normal heart sounds.  Pulmonary:     Effort: Pulmonary effort is normal.     Breath sounds: Normal breath sounds.  Abdominal:     Palpations: Abdomen is soft.     Tenderness: There is abdominal  tenderness (very mild, hard to reproduce) in the right lower quadrant.  Skin:    General: Skin is warm and dry.  Neurological:     Mental Status: He is alert.     ED Results / Procedures / Treatments   Labs (all labs ordered are listed, but only abnormal results are displayed) Labs Reviewed  CBC - Abnormal; Notable for the following components:      Result Value   WBC 11.3 (*)    All other components within normal limits  BASIC METABOLIC PANEL - Abnormal; Notable for the following components:   Glucose, Bld 166 (*)    Creatinine, Ser 1.34 (*)    All other components within normal limits  URINALYSIS, ROUTINE W REFLEX MICROSCOPIC - Abnormal; Notable for the following components:   APPearance CLOUDY (*)    Protein, ur 30 (*)    Leukocytes,Ua LARGE (*)    WBC, UA >50 (*)    Bacteria, UA MANY (*)    All other components within normal limits  URINE CULTURE    EKG None  Radiology CT ABDOMEN PELVIS W CONTRAST  Result Date: 07/08/2022 CLINICAL DATA:  Right flank pain, dysuria, suspected pyelonephritis. EXAM: CT ABDOMEN AND PELVIS WITH CONTRAST TECHNIQUE: Multidetector CT imaging of the abdomen and pelvis was performed using the standard protocol following bolus administration of intravenous contrast. RADIATION DOSE REDUCTION: This exam was performed according to the departmental dose-optimization program which includes automated exposure control, adjustment of the mA and/or kV according to patient size and/or use of iterative reconstruction technique. CONTRAST:  OMNIPAQUE IOHEXOL 300 MG/ML  SOLN COMPARISON:  CT without contrast 06/15/2022, CT with contrast 01/25/2016 FINDINGS: Lower chest: There are subpleural linear atelectasis in posterior lower lobes. No infiltrate is seen. The cardiac size is normal. Mild elevation right diaphragm. Hepatobiliary: 19.5 cm in length moderately steatotic liver again noted with sparing at the gallbladder fossa. No mass enhancement. Gallbladder and  bile ducts are unremarkable. Pancreas: No focal abnormality. Spleen: No mass enhancement. Slight splenomegaly, coronal diameter of the spleen 14.0 cm, stable. Adrenals/Urinary Tract: There is no adrenal mass. There are no areas of hypoenhancement in the renal cortex of concern for acute pyelonephritis. Stable subcentimeter hypodensity in the posterior right kidney is most likely a cyst. There is no mass enhancement. A few punctate nonobstructive caliceal stones are noted in the left kidney and several stones measuring 2-3 mm in the right kidney. Both ureters are decompressed and clear. The bladder is normal in thickness. There is no intravesical stone. Stomach/Bowel: There is a tiny hiatal hernia. The gastric wall unremarkable. Mild thickened folds noted in the left abdominal small bowel, the right lower quadrant small bowel, without inflammatory change. The retrocecal appendix is normal caliber. Chronic borderline  prominent right lower quadrant mesenteric nodes. No new or further adenopathy. Constipation and diverticulosis. Vascular/Lymphatic: No significant vascular findings are present. No enlarged abdominal or pelvic lymph nodes. Reproductive: No prostatomegaly. Other: No free air, free hemorrhage or free fluid. No incarcerated hernia. Musculoskeletal: No acute or significant osseous findings. IMPRESSION: 1. No imaging findings of acute pyelonephritis. 2. Nonobstructive nephrolithiasis. 3. No thickening of the bladder wall and ureters. 4. Nonspecific small bowel enteritis or enteropathy. Similar findings were noted previously. No inflammatory changes or small-bowel obstruction. 5. Constipation and diverticulosis. 6. Hepatic steatosis with mild enlargement of liver and spleen. No portal vein dilatation. Electronically Signed   By: Almira Bar M.D.   On: 07/08/2022 22:45    Procedures Procedures    Medications Ordered in ED Medications  sodium chloride (PF) 0.9 % injection (has no administration in time  range)  sodium chloride 0.9 % bolus 1,000 mL (0 mLs Intravenous Stopped 07/08/22 2300)  cefTRIAXone (ROCEPHIN) 1 g in sodium chloride 0.9 % 100 mL IVPB (0 g Intravenous Stopped 07/08/22 2300)  ketorolac (TORADOL) 15 MG/ML injection 15 mg (15 mg Intravenous Given 07/08/22 2204)  iohexol (OMNIPAQUE) 300 MG/ML solution 100 mL (100 mLs Intravenous Contrast Given 07/08/22 2214)    ED Course/ Medical Decision Making/ A&P                           Medical Decision Making Amount and/or Complexity of Data Reviewed Labs:     Details: Mild leukocytosis.  Mild bump in creatinine but no kidney injury.  Urine consistent with UTI. Radiology: ordered and independent interpretation performed.    Details: No obvious pyelonephritis, hydronephrosis, stone.  Risk Prescription drug management.   Patient is well-appearing.  He was given IV Toradol.  Some mild right lower quadrant tenderness and complains of a little bit of right lower back pain, so CT was obtained.  Personally viewed/interpret these images as above.  He definitely has a urinary tract infection so will need antibiotics.  It seems like the Keflex did not work.  Given the current meds he is on I do not think Cipro would be a good idea so he will be given Bactrim.  I did discuss doing a rectal exam as prostatitis would be a concern but he has deferred and declined this.  Will refer to urology given recurrent and continued UTI.  Given return precautions.        Final Clinical Impression(s) / ED Diagnoses Final diagnoses:  Urinary tract infection without hematuria, site unspecified    Rx / DC Orders ED Discharge Orders          Ordered    sulfamethoxazole-trimethoprim (BACTRIM DS) 800-160 MG tablet  2 times daily        07/08/22 2257              Pricilla Loveless, MD 07/08/22 2306

## 2022-07-08 NOTE — ED Provider Triage Note (Signed)
Emergency Medicine Provider Triage Evaluation Note  Marco Eaton , a 31 y.o. male  was evaluated in triage.  Pt complains of dysuria and right-sided flank pain.  Patient was seen for similar symptoms on 7/6 and was given Rocephin, discharged on Keflex.  He states that his pain got slightly better, but then came back and is a little bit more constant.  Has a history of kidney stones and states is always painful as that, but the dysuria is very uncomfortable.  Was not sure if he was having chills the other day, but no documented fever.  Review of Systems  Positive: Dysuria, cloudy urine, right flank pain Negative: Abdominal pain, fever, vomiting  Physical Exam  BP 129/71 (BP Location: Right Arm)   Pulse 99   Temp 98 F (36.7 C) (Oral)   Resp 15   SpO2 96%  Gen:   Awake, no distress   Resp:  Normal effort  MSK:   Moves extremities without difficulty  Other:    Medical Decision Making  Medically screening exam initiated at 4:22 PM.  Appropriate orders placed.  Marco Eaton was informed that the remainder of the evaluation will be completed by another provider, this initial triage assessment does not replace that evaluation, and the importance of remaining in the ED until their evaluation is complete.     Marco Eaton T, PA-C 07/08/22 1623

## 2022-07-11 LAB — URINE CULTURE: Culture: >=100000 — AB

## 2022-09-06 DIAGNOSIS — N390 Urinary tract infection, site not specified: Secondary | ICD-10-CM | POA: Diagnosis not present

## 2022-09-06 DIAGNOSIS — N200 Calculus of kidney: Secondary | ICD-10-CM | POA: Diagnosis not present

## 2022-09-06 DIAGNOSIS — B9689 Other specified bacterial agents as the cause of diseases classified elsewhere: Secondary | ICD-10-CM | POA: Diagnosis not present

## 2022-09-06 DIAGNOSIS — N3021 Other chronic cystitis with hematuria: Secondary | ICD-10-CM | POA: Diagnosis not present

## 2022-11-24 DIAGNOSIS — N3021 Other chronic cystitis with hematuria: Secondary | ICD-10-CM | POA: Diagnosis not present
